# Patient Record
Sex: Female | Born: 1950 | Hispanic: No | Smoking: Never smoker
Health system: Southern US, Community
[De-identification: ages and names within clinical notes are randomized; demographics above are authoritative.]

## PROBLEM LIST (undated history)

## (undated) DIAGNOSIS — R5382 Chronic fatigue, unspecified: Secondary | ICD-10-CM

## (undated) DIAGNOSIS — E7800 Pure hypercholesterolemia, unspecified: Secondary | ICD-10-CM

## (undated) DIAGNOSIS — M1990 Unspecified osteoarthritis, unspecified site: Secondary | ICD-10-CM

## (undated) DIAGNOSIS — C50919 Malignant neoplasm of unspecified site of unspecified female breast: Secondary | ICD-10-CM

## (undated) DIAGNOSIS — D649 Anemia, unspecified: Secondary | ICD-10-CM

## (undated) DIAGNOSIS — I10 Essential (primary) hypertension: Secondary | ICD-10-CM

## (undated) DIAGNOSIS — E119 Type 2 diabetes mellitus without complications: Secondary | ICD-10-CM

## (undated) HISTORY — PX: MASTECTOMY: SHX3

## (undated) HISTORY — PX: EYE SURGERY: SHX253

## (undated) HISTORY — PX: COLONOSCOPY: SHX174

---

## 2014-09-05 ENCOUNTER — Encounter (HOSPITAL_COMMUNITY)
Admission: RE | Admit: 2014-09-05 | Discharge: 2014-09-05 | Disposition: A | Discharge status: Home / Self Care | Payer: Self-pay | Source: Ambulatory Visit | Attending: Gastroenterology | Admitting: Gastroenterology

## 2014-09-05 ENCOUNTER — Other Ambulatory Visit (HOSPITAL_COMMUNITY): Payer: Self-pay | Admitting: *Deleted

## 2014-09-05 DIAGNOSIS — D500 Iron deficiency anemia secondary to blood loss (chronic): Secondary | ICD-10-CM | POA: Insufficient documentation

## 2014-09-05 LAB — PREPARE RBC (CROSSMATCH)

## 2014-09-05 LAB — ABO/RH: ABO/RH(D): B POS

## 2014-09-06 ENCOUNTER — Encounter (HOSPITAL_COMMUNITY)
Admission: RE | Admit: 2014-09-06 | Discharge: 2014-09-06 | Disposition: A | Discharge status: Home / Self Care | Payer: Self-pay | Source: Ambulatory Visit | Attending: Gastroenterology | Admitting: Gastroenterology

## 2014-09-06 ENCOUNTER — Encounter (HOSPITAL_COMMUNITY): Payer: Self-pay

## 2014-09-06 MED ORDER — SODIUM CHLORIDE 0.9 % IV SOLN: Freq: Once | INTRAVENOUS | Status: DC

## 2014-09-07 LAB — TYPE AND SCREEN
ABO/RH(D): B POS
Antibody Screen: NEGATIVE
Unit division: 0
Unit division: 0

## 2015-11-03 ENCOUNTER — Other Ambulatory Visit: Payer: Self-pay | Admitting: Family Medicine

## 2015-11-03 DIAGNOSIS — N6320 Unspecified lump in the left breast, unspecified quadrant: Secondary | ICD-10-CM

## 2015-11-08 ENCOUNTER — Other Ambulatory Visit: Payer: Self-pay | Admitting: Family Medicine

## 2015-11-08 ENCOUNTER — Ambulatory Visit
Admission: RE | Admit: 2015-11-08 | Discharge: 2015-11-08 | Disposition: A | Discharge status: Home / Self Care | Payer: No Typology Code available for payment source | Source: Ambulatory Visit | Attending: Family Medicine | Admitting: Family Medicine

## 2015-11-08 DIAGNOSIS — N6320 Unspecified lump in the left breast, unspecified quadrant: Secondary | ICD-10-CM

## 2015-11-08 DIAGNOSIS — N6310 Unspecified lump in the right breast, unspecified quadrant: Secondary | ICD-10-CM

## 2015-11-14 ENCOUNTER — Other Ambulatory Visit: Payer: Self-pay | Admitting: Family Medicine

## 2015-11-14 DIAGNOSIS — N6320 Unspecified lump in the left breast, unspecified quadrant: Secondary | ICD-10-CM

## 2015-11-16 ENCOUNTER — Ambulatory Visit
Admission: RE | Admit: 2015-11-16 | Discharge: 2015-11-16 | Disposition: A | Discharge status: Home / Self Care | Payer: No Typology Code available for payment source | Source: Ambulatory Visit | Attending: Family Medicine | Admitting: Family Medicine

## 2015-11-16 ENCOUNTER — Other Ambulatory Visit: Payer: Self-pay | Admitting: Family Medicine

## 2015-11-16 DIAGNOSIS — N6320 Unspecified lump in the left breast, unspecified quadrant: Secondary | ICD-10-CM

## 2015-11-16 DIAGNOSIS — C50919 Malignant neoplasm of unspecified site of unspecified female breast: Secondary | ICD-10-CM

## 2015-11-16 HISTORY — DX: Malignant neoplasm of unspecified site of unspecified female breast: C50.919

## 2015-11-27 ENCOUNTER — Ambulatory Visit: Payer: Self-pay | Admitting: Surgery

## 2015-11-30 ENCOUNTER — Other Ambulatory Visit: Payer: Self-pay | Admitting: Surgery

## 2015-11-30 ENCOUNTER — Encounter: Payer: Self-pay | Admitting: Radiation Oncology

## 2015-11-30 DIAGNOSIS — C50912 Malignant neoplasm of unspecified site of left female breast: Secondary | ICD-10-CM

## 2015-12-01 ENCOUNTER — Other Ambulatory Visit: Payer: No Typology Code available for payment source

## 2015-12-04 ENCOUNTER — Encounter: Payer: Self-pay | Admitting: *Deleted

## 2015-12-04 ENCOUNTER — Encounter: Payer: Self-pay | Admitting: Hematology and Oncology

## 2015-12-04 ENCOUNTER — Ambulatory Visit (HOSPITAL_BASED_OUTPATIENT_CLINIC_OR_DEPARTMENT_OTHER): Payer: Self-pay | Admitting: Hematology and Oncology

## 2015-12-04 DIAGNOSIS — C50512 Malignant neoplasm of lower-outer quadrant of left female breast: Secondary | ICD-10-CM

## 2015-12-04 DIAGNOSIS — C773 Secondary and unspecified malignant neoplasm of axilla and upper limb lymph nodes: Secondary | ICD-10-CM

## 2015-12-04 MED ORDER — ANASTROZOLE 1 MG PO TABS
1.0000 mg | ORAL_TABLET | Freq: Every day | ORAL | 3 refills | Status: DC
Start: 2015-12-04 — End: 2016-02-13

## 2015-12-04 MED FILL — ANASTROZOLE 1 MG TABLET: 1 | 60 days supply | Qty: 60 | Fill #0

## 2015-12-04 NOTE — Progress Notes (Signed)
[facility] CONSULT NOTE  Patient Care Team: Pcp Not In System as PCP - General  CHIEF COMPLAINTS/PURPOSE OF CONSULTATION:  Newly diagnosed breast cancer  HISTORY OF PRESENTING ILLNESS:  Theresa Zhang 65 y.o. female is here because of recent diagnosis of left breast cancer. Patient felt a lump about 6 weeks ago and was sent to undergo a mammogram. It revealed 2 nodules measuring 2 cm and 1.4 cm. 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 She was seen by Dr. Brantley Stage to discuss surgical options and she is here today to discuss a treatment plan with me. She is accompanied by her son. Patient speaks Hindi   I reviewed her records extensively and collaborated the history with the patient.  SUMMARY OF ONCOLOGIC HISTORY:   Breast cancer of lower-outer quadrant of left female breast ([place])   11/08/2015 Mammogram    Left breast 5:30 position: 1.7 x 1.6 x 2 cm spiculated mass; 6:00: 1.2 x 1.3 x 1.4 cm separated by 3 cm, left axillary lymph nodes 9 mm, clinical stage TIcN1 stage II A     11/16/2015 Initial Diagnosis    Left breast biopsy: 6:00: Grade 2-3 IDC with DCIS with LVI, ER 90%, PR 95%, Ki-67 3%, HER-2 negative ratio 1.33, left breast biopsy 5:30 position: Grade 2-3 IDC, DCIS, LVI, ER 90%, PR 95%, Ki-67 5%, HER-2 negative ratio 1.35     MEDICAL HISTORY:  Past Medical History:  Diagnosis Date  . Anemia   . Arthritis   . Breast cancer ([place]) 11/16/2015   Left Breast  . Chronic fatigue   . Diabetes mellitus without complication ([place])   . DJD (degenerative joint disease)    knee  . Hypercholesterolemia   . Hypertension     SURGICAL HISTORY: No past surgical history on file.  SOCIAL HISTORY: Social History   Social History  . Marital status: Widowed    Spouse name: N/A  . Number of children: N/A  . Years of education: N/A   Occupational History  . Not  on file.   Social History Main Topics  . Smoking status: Never Smoker  . Smokeless tobacco: Never Used  . Alcohol use No  . Drug use: No  . Sexual activity: Not on file   Other Topics Concern  . Not on file   Social History Narrative  . No narrative on file    FAMILY HISTORY: No family history of any breast cancer  ALLERGIES:  is allergic to metformin.  MEDICATIONS:  Current Outpatient Prescriptions  Medication Sig Dispense Refill  . anastrozole (ARIMIDEX) 1 MG tablet Take 1 tablet (1 mg total) by mouth daily. 90 tablet 3  . glimepiride (AMARYL) 4 MG tablet Take 4 mg by mouth daily. Before breakfast    . losartan (COZAAR) 25 MG tablet Take 50 mg by mouth daily.    . meloxicam (MOBIC) 15 MG tablet Take 15 mg by mouth daily.    . metoprolol succinate (TOPROL-XL) 25 MG 24 hr tablet Take 25 mg by mouth daily.     No current facility-administered medications for this visit.     REVIEW OF SYSTEMS:   Constitutional: Denies fevers, chills or abnormal night sweats Eyes: Denies blurriness of vision, double vision or watery eyes Ears, nose, mouth, throat, and face: Denies mucositis or sore throat Respiratory: Denies cough, dyspnea or wheezes Cardiovascular: Denies palpitation, chest discomfort or lower  extremity swelling Gastrointestinal:  Denies nausea, heartburn or change in bowel habits Skin: Denies abnormal skin rashes Lymphatics: Denies new lymphadenopathy or easy bruising Neurological:Denies numbness, tingling or new weaknesses Behavioral/Psych: Mood is stable, no new changes  Breast: Palpable lumps in the left breast All other systems were reviewed with the patient and are negative.  PHYSICAL EXAMINATION: ECOG PERFORMANCE STATUS: 1 - Symptomatic but completely ambulatory  Vitals:   12/04/15 1324  BP: (!) 180/65  Pulse: 87  Resp: 18  Temp: 97.6 F (36.4 C)   Filed Weights   12/04/15 1324  Weight: 178 lb 3.2 oz (80.8 kg)    GENERAL:alert, no distress and  comfortable SKIN: skin color, texture, turgor are normal, no rashes or significant lesions EYES: normal, conjunctiva are pink and non-injected, sclera clear OROPHARYNX:no exudate, no erythema and lips, buccal mucosa, and tongue normal  NECK: supple, thyroid normal size, non-tender, without nodularity LYMPH:  no palpable lymphadenopathy in the cervical, axillary or inguinal LUNGS: clear to auscultation and percussion with normal breathing effort HEART: regular rate & rhythm and no murmurs and no lower extremity edema ABDOMEN:abdomen soft, non-tender and normal bowel sounds Musculoskeletal:no cyanosis of digits and no clubbing  PSYCH: alert & oriented x 3 with fluent speech NEURO: no focal motor/sensory deficits BREAST: 2 palpable lumps in the left breast. No palpable axillary or supraclavicular lymphadenopathy (exam performed in the presence of a chaperone)   RADIOGRAPHIC STUDIES: I have personally reviewed the radiological reports and agreed with the findings in the report.  ASSESSMENT AND PLAN:  Breast cancer of lower-outer quadrant of left female breast ([place]) Left breast 5:30 position 11/08/2015: 1.7 x 1.6 x 2 cm spiculated mass; 6:00: 1.2 x 1.3 x 1.4 cm separated by 3 cm, left axillary lymph nodes 9 mm, clinical stage TIcN1 stage II A Left breast biopsy 11/16/2015: 6:00: Grade 2-3 IDC with DCIS with LVI, ER 90%, PR 95%, Ki-67 3%, HER-2 negative ratio 1.33, left breast biopsy 5:30 position: Grade 2-3 IDC, DCIS, LVI, ER 90%, PR 95%, Ki-67 5%, HER-2 negative ratio 1.35  Pathology and radiology counseling: Discussed with the patient, the details of pathology including the type of breast cancer,the clinical staging, the significance of ER, PR and HER-2/neu receptors and the implications for treatment. After reviewing the pathology in detail, we proceeded to discuss the different treatment options between surgery, radiation, chemotherapy, antiestrogen therapies.  Recommendation: 1. I started  Neoadjuvant therapy with antiestrogen therapy anastrozole 1 mg daily until the decision regarding surgery has been made. 2. Surgery: We discussed at length the importance of surgery including breast cancer. After discussion she was agreeable to it. I will discuss the case with Dr. Brantley Stage to see if he would consider doing a lumpectomy versus mastectomy. Patient understands that if she were to get lumpectomy then she will need adjuvant radiation definitely.  3. Followed by radiation 4. Followed by antiestrogen therapy  Patient is in favor of lumpectomy with radiation.  I will call the patient back to discuss the final decision regarding surgery versus neoadjuvant therapy. If we need to do neoadjuvant chemotherapy, I would consider sending for Oncotype DX testing or Mammaprint testing. We may also have to consider obtaining a lymph node biopsy and a breast MRI if neoadjuvant therapy is the plan.  Patient is uninsured and would like to meet with financial counselors. We provided her with this information. I spent one half hour discussing treatment options with the patient and her son and she understands this and is willing to  go through the recommended treatment. She was originally quite reluctant to consider surgery.    All questions were answered. The patient knows to call the clinic with any problems, questions or concerns.    Rulon Eisenmenger, MD 12/04/15

## 2015-12-04 NOTE — Assessment & Plan Note (Signed)
Left breast 5:30 position 11/08/2015: 1.7 x 1.6 x 2 cm spiculated mass; 6:00: 1.2 x 1.3 x 1.4 cm separated by 3 cm, left axillary lymph nodes 9 mm, clinical stage TIcN1 stage II A Left breast biopsy 11/16/2015: 6:00: Grade 2-3 IDC with DCIS with LVI, ER 90%, PR 95%, Ki-67 3%, HER-2 negative ratio 1.33, left breast biopsy 5:30 position: Grade 2-3 IDC, DCIS, LVI, ER 90%, PR 95%, Ki-67 5%, HER-2 negative ratio 1.35  Pathology and radiology counseling: Discussed with the patient, the details of pathology including the type of breast cancer,the clinical staging, the significance of ER, PR and HER-2/neu receptors and the implications for treatment. After reviewing the pathology in detail, we proceeded to discuss the different treatment options between surgery, radiation, chemotherapy, antiestrogen therapies.  Recommendation: 1. Axillary lymph node biopsy 2. If the lymph node is positive: Mammaprint testing on the biopsy to determine if she would need chemotherapy 3. If the lymph node is negative: Oncotype DX testing on the biopsy to determine if she would need chemotherapy 4. Neoadjuvant therapy either with chemotherapy or antiestrogen therapy 5. Followed by surgery 6. Followed by radiation 7. Followed by antiestrogen therapy

## 2015-12-06 ENCOUNTER — Ambulatory Visit
Admission: RE | Admit: 2015-12-06 | Discharge: 2015-12-06 | Disposition: A | Discharge status: Home / Self Care | Payer: No Typology Code available for payment source | Source: Ambulatory Visit | Attending: Radiation Oncology | Admitting: Radiation Oncology

## 2015-12-06 ENCOUNTER — Ambulatory Visit: Payer: No Typology Code available for payment source

## 2015-12-06 HISTORY — DX: Chronic fatigue, unspecified: R53.82

## 2015-12-06 HISTORY — DX: Anemia, unspecified: D64.9

## 2015-12-06 HISTORY — DX: Essential (primary) hypertension: I10

## 2015-12-06 HISTORY — DX: Pure hypercholesterolemia, unspecified: E78.00

## 2015-12-06 HISTORY — DX: Unspecified osteoarthritis, unspecified site: M19.90

## 2015-12-06 HISTORY — DX: Malignant neoplasm of unspecified site of unspecified female breast: C50.919

## 2015-12-06 HISTORY — DX: Type 2 diabetes mellitus without complications: E11.9

## 2015-12-11 ENCOUNTER — Other Ambulatory Visit: Payer: No Typology Code available for payment source

## 2015-12-27 ENCOUNTER — Telehealth: Payer: Self-pay | Admitting: *Deleted

## 2015-12-27 NOTE — Telephone Encounter (Signed)
Son states he is not sure what the plan is for his mother. Saw Dr Lindi Adie on 8/7 and has "not heard anything else". States patient does not have insurance. He says she has an appt on 9/11 with Dr Brantley Stage. Informed him that is part of the process to see what needs to be done per Dr Geralyn Flash office visit notes- lumpectomy VS mastectomy. Verbalized understanding. Said that scan will be $2400 out of pocket. Encouraged him to call Dr Cornett's office regarding scan. Once a plan is decided upon, they will meet with financial counselors here at [facility]. Please advise.

## 2016-01-08 ENCOUNTER — Ambulatory Visit: Payer: Self-pay | Admitting: Surgery

## 2016-01-08 DIAGNOSIS — C50912 Malignant neoplasm of unspecified site of left female breast: Secondary | ICD-10-CM

## 2016-01-08 NOTE — H&P (Signed)
Theresa Zhang 01/08/2016 10:48 AM Location: [facility] Patient #: [id number] DOB: 18-Oct-1950 Widowed / Language: Hindi / Race: Refused to Report/Unreported Female  History of Present Illness Theresa Zhang A. Jaja Switalski MD; 01/08/2016 11:33 AM) Patient words: Patient seen at the request of Dr. Theda Sers for left breast cancer. She noticed to masses and pain in her left breast about 2 months ago. Mammogram revealed lesions in the left lower inner quadrant of the left breast each measuring roughly 1.7 cm. They're separated by 3 cm. Core biopsy showed invasive ductal carcinoma ER positive PR positive HER-2/neu negative for both. She is accompanied by her family. Patient's only complaint was left breast pain.     Per oncology   Theresa Zhang 65 y.o. female is here because of recent diagnosis of left breast cancer. Patient felt a lump about 6 weeks ago and was sent to undergo a mammogram. It revealed 2 nodules measuring 2 cm and 1.4 cm. 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 She was seen by Dr. Brantley Stage to discuss surgical options and she is here today to discuss a treatment plan with me. She is accompanied by her son. Patient speaks Hindi              Discussed with the patient, the details of pathology including the type of breast cancer,the clinical staging, the significance of ER, PR and HER-2/neu receptors and the implications for treatment. After reviewing the pathology in detail, we proceeded to discuss the different treatment options between surgery, radiation, chemotherapy, antiestrogen therapies.  Recommendation: 1. Axillary lymph node biopsy 2. If the lymph node is positive: Mammaprint testing on the biopsy to determine if she would need chemotherapy 3. If the lymph node is negative: Oncotype DX testing on the biopsy to determine if she would need  chemotherapy 4. Neoadjuvant therapy either with chemotherapy or antiestrogen therapy 5. Followed by surgery 6. Followed by radiation 7. Followed by antiestrogen therapy.  The patient is a 65 year old female.   Allergies ([name], CMA; 01/08/2016 10:49 AM) MetFORMIN HCl *ANTIDIABETICS*  Medication History ([name], CMA; 01/08/2016 10:49 AM) Losartan Potassium (25MG Tablet, Oral) Active. Metoprolol Succinate ER (25MG Tablet ER 24HR, Oral) Active. Meloxicam (15MG Tablet, Oral) Active. Glimepiride (4MG Tablet, Oral) Active. Medications Reconciled    Vitals ([name] CMA; 01/08/2016 10:49 AM) 01/08/2016 10:48 AM Weight: 172 lb Height: 59in Body Surface Area: 1.73 m Body Mass Index: 34.74 kg/m  Temp.: 70F(Temporal)  Pulse: 73 (Regular)  BP: 128/76 (Sitting, Left Arm, Standard)      Physical Exam ([name] A. Lon Klippel MD; 01/08/2016 11:34 AM)  General Mental Status-Alert. General Appearance-Consistent with stated age. Hydration-Well hydrated. Voice-Normal.  Breast Note: not repeated  Musculoskeletal Normal Exam - Left-Upper Extremity Strength Normal and Lower Extremity Strength Normal. Normal Exam - Right-Upper Extremity Strength Normal, Lower Extremity Weakness.    Assessment & Plan ([name] A. Edenilson Austad MD; 01/08/2016 11:34 AM)  BREAST CANCER, LEFT (C50.912) Impression: Case discussed with Dr. Payton Mccallum of oncology. Patient desires left simple mastectomy with left sentinel lymph node mapping. She has no interest in reconstruction. I discussed this with the patient and family members today who helped to communicate with the patient. Questions were answered. Risk was reviewed as well as long-term expectations, treatments and possible complications. Discussed treatment options for breast cancer to include breast conservation vs mastectomy with reconstruction. Pt has decided on mastectomy. Risk include  bleeding, infection, flap necrosis, pain,  numbness, recurrence, hematoma, other surgery needs. Pt understands and agrees to proceed.  Current Plans You are being scheduled for surgery - Our schedulers will call you.  You should hear from our office's scheduling department within 5 working days about the location, date, and time of surgery. We try to make accommodations for patient's preferences in scheduling surgery, but sometimes the OR schedule or the surgeon's schedule prevents [place] from making those accommodations.  If you have not heard from our office [phone]) in 5 working days, call the office and ask for your surgeon's nurse.  If you have other questions about your diagnosis, plan, or surgery, call the office and ask for your surgeon's nurse.  Pt Education - [facility] Breast Cancer Information Given - Alight "Breast Journey" Package We discussed the staging and pathophysiology of breast cancer. We discussed all of the different options for treatment for breast cancer including surgery, chemotherapy, radiation therapy, Herceptin, and antiestrogen therapy. We discussed a sentinel lymph node biopsy as she does not appear to having lymph node involvement right now. We discussed the performance of that with injection of radioactive tracer and blue dye. We discussed that she would have an incision underneath her axillary hairline. We discussed that there is a bout a 10-20% chance of having a positive node with a sentinel lymph node biopsy and we will await the permanent pathology to make any other first further decisions in terms of her treatment. One of these options might be to return to the operating room to perform an axillary lymph node dissection. We discussed about a 1-2% risk lifetime of chronic shoulder pain as well as lymphedema associated with a sentinel lymph node biopsy. We discussed the options for treatment of the breast cancer which included lumpectomy versus a mastectomy. We discussed the performance of the lumpectomy with a  wire placement. We discussed a 10-20% chance of a positive margin requiring reexcision in the operating room. We also discussed that she may need radiation therapy or antiestrogen therapy or both if she undergoes lumpectomy. We discussed the mastectomy and the postoperative care for that as well. We discussed that there is no difference in her survival whether she undergoes lumpectomy with radiation therapy or antiestrogen therapy versus a mastectomy. There is a slight difference in the local recurrence rate being 3-5% with lumpectomy and about 1% with a mastectomy. We discussed the risks of operation including bleeding, infection, possible reoperation. She understands her further therapy will be based on what her stages at the time of her operation.  Pt Education - flb breast cancer surgery: discussed with patient and provided information. Pt Education - [facility] Mastectomy HCI Pt Education - ABC (After Breast Cancer) Class Info: discussed with patient and provided information.

## 2016-01-10 ENCOUNTER — Telehealth: Payer: Self-pay | Admitting: Hematology and Oncology

## 2016-01-10 ENCOUNTER — Encounter (HOSPITAL_COMMUNITY): Payer: Self-pay | Admitting: *Deleted

## 2016-01-10 MED ORDER — DEXTROSE 5 % IV SOLN
3.0000 g | INTRAVENOUS | Status: AC
  Administered 2016-01-11: 3 g via INTRAVENOUS
  Filled 2016-01-10: qty 3000

## 2016-01-10 NOTE — Telephone Encounter (Signed)
lvm using language line to inform pt of appt 9/25 per LOS

## 2016-01-10 NOTE — Progress Notes (Signed)
Pt speaks Hindi. Called pt using [employer]. Spoke with pt, but she okayed her daughter-in-law, Festus Holts to talk to me. Festus Holts speaks [place]. Sonya ended her side of the call. Festus Holts states pt does not have a cardiac history. Pt is diabetic but does not check her blood sugar at home due to being "deathly afraid of needles" per [place]. She states pt's last A1C was 7.0 about 4 months ago.

## 2016-01-11 ENCOUNTER — Encounter (HOSPITAL_COMMUNITY)
Admission: RE | Disposition: A | Discharge status: Home / Self Care | Payer: Self-pay | Source: Ambulatory Visit | Attending: Surgery

## 2016-01-11 ENCOUNTER — Observation Stay (HOSPITAL_COMMUNITY)
Admission: RE | Admit: 2016-01-11 | Discharge: 2016-01-12 | Disposition: A | Discharge status: Home / Self Care | Payer: No Typology Code available for payment source | Source: Ambulatory Visit | Attending: Surgery | Admitting: Surgery

## 2016-01-11 ENCOUNTER — Encounter (HOSPITAL_COMMUNITY): Payer: Self-pay | Admitting: *Deleted

## 2016-01-11 ENCOUNTER — Ambulatory Visit (HOSPITAL_COMMUNITY)
Admission: RE | Admit: 2016-01-11 | Discharge: 2016-01-11 | Disposition: A | Discharge status: Home / Self Care | Payer: Self-pay | Source: Ambulatory Visit | Attending: Surgery | Admitting: Surgery

## 2016-01-11 ENCOUNTER — Ambulatory Visit (HOSPITAL_COMMUNITY): Payer: Self-pay | Admitting: Certified Registered Nurse Anesthetist

## 2016-01-11 DIAGNOSIS — C50312 Malignant neoplasm of lower-inner quadrant of left female breast: Principal | ICD-10-CM | POA: Insufficient documentation

## 2016-01-11 DIAGNOSIS — E119 Type 2 diabetes mellitus without complications: Secondary | ICD-10-CM | POA: Insufficient documentation

## 2016-01-11 DIAGNOSIS — Z170 Estrogen receptor positive status [ER+]: Secondary | ICD-10-CM | POA: Insufficient documentation

## 2016-01-11 DIAGNOSIS — Z791 Long term (current) use of non-steroidal anti-inflammatories (NSAID): Secondary | ICD-10-CM | POA: Insufficient documentation

## 2016-01-11 DIAGNOSIS — Z7984 Long term (current) use of oral hypoglycemic drugs: Secondary | ICD-10-CM | POA: Insufficient documentation

## 2016-01-11 DIAGNOSIS — C50412 Malignant neoplasm of upper-outer quadrant of left female breast: Secondary | ICD-10-CM | POA: Diagnosis present

## 2016-01-11 DIAGNOSIS — C50912 Malignant neoplasm of unspecified site of left female breast: Secondary | ICD-10-CM

## 2016-01-11 DIAGNOSIS — Z79899 Other long term (current) drug therapy: Secondary | ICD-10-CM | POA: Insufficient documentation

## 2016-01-11 DIAGNOSIS — I10 Essential (primary) hypertension: Secondary | ICD-10-CM | POA: Insufficient documentation

## 2016-01-11 HISTORY — PX: MASTECTOMY W/ SENTINEL NODE BIOPSY: SHX2001

## 2016-01-11 LAB — CBC WITH DIFFERENTIAL/PLATELET
BASOS ABS: 0 10*3/uL (ref 0.0–0.1)
BASOS PCT: 0 %
EOS ABS: 0.1 10*3/uL (ref 0.0–0.7)
EOS PCT: 2 %
HCT: 39 % (ref 36.0–46.0)
Hemoglobin: 12 g/dL (ref 12.0–15.0)
Lymphocytes Relative: 24 %
Lymphs Abs: 1.8 10*3/uL (ref 0.7–4.0)
MCH: 25.3 pg — ABNORMAL LOW (ref 26.0–34.0)
MCHC: 30.8 g/dL (ref 30.0–36.0)
MCV: 82.3 fL (ref 78.0–100.0)
MONO ABS: 0.5 10*3/uL (ref 0.1–1.0)
Monocytes Relative: 7 %
Neutro Abs: 5.1 10*3/uL (ref 1.7–7.7)
Neutrophils Relative %: 67 %
PLATELETS: 290 10*3/uL (ref 150–400)
RBC: 4.74 MIL/uL (ref 3.87–5.11)
RDW: 13.9 % (ref 11.5–15.5)
WBC: 7.5 10*3/uL (ref 4.0–10.5)

## 2016-01-11 LAB — COMPREHENSIVE METABOLIC PANEL
ALT: 35 U/L (ref 14–54)
AST: 39 U/L (ref 15–41)
Albumin: 3.5 g/dL (ref 3.5–5.0)
Alkaline Phosphatase: 80 U/L (ref 38–126)
Anion gap: 8 (ref 5–15)
BUN: 26 mg/dL — ABNORMAL HIGH (ref 6–20)
CO2: 25 mmol/L (ref 22–32)
Calcium: 9.3 mg/dL (ref 8.9–10.3)
Chloride: 107 mmol/L (ref 101–111)
Creatinine, Ser: 0.87 mg/dL (ref 0.44–1.00)
GFR calc Af Amer: >60 mL/min (ref >60)
GFR calc non Af Amer: >60 mL/min (ref >60)
Glucose, Bld: 102 mg/dL — ABNORMAL HIGH (ref 65–99)
Potassium: 4.5 mmol/L (ref 3.5–5.1)
Sodium: 140 mmol/L (ref 135–145)
Total Bilirubin: 0.5 mg/dL (ref 0.3–1.2)
Total Protein: 7.3 g/dL (ref 6.5–8.1)

## 2016-01-11 LAB — GLUCOSE, CAPILLARY
GLUCOSE-CAPILLARY: 126 mg/dL — AB (ref 65–99)
GLUCOSE-CAPILLARY: 183 mg/dL — AB (ref 65–99)
Glucose-Capillary: 100 mg/dL — ABNORMAL HIGH (ref 65–99)
Glucose-Capillary: 188 mg/dL — ABNORMAL HIGH (ref 65–99)

## 2016-01-11 SURGERY — MASTECTOMY WITH SENTINEL LYMPH NODE BIOPSY
Anesthesia: Regional | Site: Breast | Laterality: Left

## 2016-01-11 MED ORDER — FENTANYL CITRATE (PF) 100 MCG/2ML IJ SOLN
INTRAMUSCULAR | Status: AC
  Filled 2016-01-11: qty 2

## 2016-01-11 MED ORDER — HYDRALAZINE HCL 20 MG/ML IJ SOLN: 10.0000 mg | INTRAMUSCULAR | Status: DC | PRN

## 2016-01-11 MED ORDER — MIDAZOLAM HCL 2 MG/2ML IJ SOLN
INTRAMUSCULAR | Status: AC
  Filled 2016-01-11: qty 2

## 2016-01-11 MED ORDER — POLYETHYLENE GLYCOL 3350 17 G PO PACK: 17.0000 g | PACK | Freq: Every day | ORAL | Status: DC | PRN

## 2016-01-11 MED ORDER — PROPOFOL 10 MG/ML IV BOLUS
INTRAVENOUS | Status: DC | PRN
  Administered 2016-01-11: 150 mg via INTRAVENOUS

## 2016-01-11 MED ORDER — BUPIVACAINE-EPINEPHRINE (PF) 0.5% -1:200000 IJ SOLN
INTRAMUSCULAR | Status: DC | PRN
  Administered 2016-01-11: 25 mL

## 2016-01-11 MED ORDER — KCL IN DEXTROSE-NACL 20-5-0.9 MEQ/L-%-% IV SOLN
INTRAVENOUS | Status: DC
  Administered 2016-01-11 – 2016-01-12 (×2): via INTRAVENOUS
  Filled 2016-01-11 (×2): qty 1000

## 2016-01-11 MED ORDER — HYDROMORPHONE HCL 1 MG/ML IJ SOLN: 0.2500 mg | INTRAMUSCULAR | Status: DC | PRN

## 2016-01-11 MED ORDER — MIDAZOLAM HCL 2 MG/2ML IJ SOLN
1.0000 mg | Freq: Once | INTRAMUSCULAR | Status: AC
  Administered 2016-01-11: 1 mg via INTRAVENOUS

## 2016-01-11 MED ORDER — ONDANSETRON HCL 4 MG/2ML IJ SOLN
INTRAMUSCULAR | Status: AC
  Filled 2016-01-11: qty 2

## 2016-01-11 MED ORDER — TECHNETIUM TC 99M SULFUR COLLOID FILTERED
1.0000 | Freq: Once | INTRAVENOUS | Status: AC | PRN
  Administered 2016-01-11: 1 via INTRADERMAL

## 2016-01-11 MED ORDER — PROPOFOL 10 MG/ML IV BOLUS
INTRAVENOUS | Status: AC
  Filled 2016-01-11: qty 20

## 2016-01-11 MED ORDER — PROMETHAZINE HCL 25 MG/ML IJ SOLN: 6.2500 mg | INTRAMUSCULAR | Status: DC | PRN

## 2016-01-11 MED ORDER — CHLORHEXIDINE GLUCONATE CLOTH 2 % EX PADS: 6.0000 | MEDICATED_PAD | Freq: Once | CUTANEOUS | Status: DC

## 2016-01-11 MED ORDER — ONDANSETRON 4 MG PO TBDP: 4.0000 mg | ORAL_TABLET | Freq: Four times a day (QID) | ORAL | Status: DC | PRN

## 2016-01-11 MED ORDER — ENOXAPARIN SODIUM 40 MG/0.4ML ~~LOC~~ SOLN
40.0000 mg | SUBCUTANEOUS | Status: DC
  Administered 2016-01-12: 40 mg via SUBCUTANEOUS
  Filled 2016-01-11: qty 0.4

## 2016-01-11 MED ORDER — EPHEDRINE SULFATE 50 MG/ML IJ SOLN
INTRAMUSCULAR | Status: DC | PRN
  Administered 2016-01-11: 5 mg via INTRAVENOUS

## 2016-01-11 MED ORDER — DEXAMETHASONE SODIUM PHOSPHATE 10 MG/ML IJ SOLN
INTRAMUSCULAR | Status: AC
  Filled 2016-01-11: qty 1

## 2016-01-11 MED ORDER — HYDROMORPHONE HCL 1 MG/ML IJ SOLN
1.0000 mg | INTRAMUSCULAR | Status: DC | PRN
Start: 2016-01-11 — End: 2016-01-12

## 2016-01-11 MED ORDER — METHOCARBAMOL 500 MG PO TABS
500.0000 mg | ORAL_TABLET | Freq: Four times a day (QID) | ORAL | Status: DC | PRN
  Administered 2016-01-11 – 2016-01-12 (×2): 500 mg via ORAL
  Filled 2016-01-11 (×2): qty 1

## 2016-01-11 MED ORDER — OXYCODONE HCL 5 MG PO TABS
5.0000 mg | ORAL_TABLET | ORAL | Status: DC | PRN
  Administered 2016-01-11: 10 mg via ORAL
  Filled 2016-01-11: qty 2

## 2016-01-11 MED ORDER — ONDANSETRON HCL 4 MG/2ML IJ SOLN
INTRAMUSCULAR | Status: DC | PRN
  Administered 2016-01-11: 4 mg via INTRAVENOUS

## 2016-01-11 MED ORDER — PHENYLEPHRINE 40 MCG/ML (10ML) SYRINGE FOR IV PUSH (FOR BLOOD PRESSURE SUPPORT)
PREFILLED_SYRINGE | INTRAVENOUS | Status: AC
  Filled 2016-01-11: qty 10

## 2016-01-11 MED ORDER — SIMETHICONE 80 MG PO CHEW: 40.0000 mg | CHEWABLE_TABLET | Freq: Four times a day (QID) | ORAL | Status: DC | PRN

## 2016-01-11 MED ORDER — METOPROLOL SUCCINATE ER 25 MG PO TB24
25.0000 mg | ORAL_TABLET | Freq: Every day | ORAL | Status: DC
  Administered 2016-01-12: 25 mg via ORAL
  Filled 2016-01-11 (×2): qty 1

## 2016-01-11 MED ORDER — ONDANSETRON HCL 4 MG/2ML IJ SOLN: 4.0000 mg | Freq: Four times a day (QID) | INTRAMUSCULAR | Status: DC | PRN

## 2016-01-11 MED ORDER — PHENYLEPHRINE HCL 10 MG/ML IJ SOLN
INTRAMUSCULAR | Status: DC | PRN
  Administered 2016-01-11: 80 ug via INTRAVENOUS
  Administered 2016-01-11: 120 ug via INTRAVENOUS
  Administered 2016-01-11: 80 ug via INTRAVENOUS
  Administered 2016-01-11: 120 ug via INTRAVENOUS
  Administered 2016-01-11 (×2): 80 ug via INTRAVENOUS

## 2016-01-11 MED ORDER — LIDOCAINE 2% (20 MG/ML) 5 ML SYRINGE
INTRAMUSCULAR | Status: AC
  Filled 2016-01-11: qty 10

## 2016-01-11 MED ORDER — SODIUM CHLORIDE 0.9 % IJ SOLN
INTRAVENOUS | Status: DC | PRN
  Administered 2016-01-11: 2 mL

## 2016-01-11 MED ORDER — HYDROCODONE-ACETAMINOPHEN 7.5-325 MG PO TABS: 1.0000 | ORAL_TABLET | Freq: Once | ORAL | Status: DC | PRN

## 2016-01-11 MED ORDER — LIDOCAINE HCL (CARDIAC) 20 MG/ML IV SOLN
INTRAVENOUS | Status: DC | PRN
  Administered 2016-01-11: 60 mg via INTRAVENOUS

## 2016-01-11 MED ORDER — EPHEDRINE 5 MG/ML INJ
INTRAVENOUS | Status: AC
  Filled 2016-01-11: qty 10

## 2016-01-11 MED ORDER — ROCURONIUM BROMIDE 10 MG/ML (PF) SYRINGE
PREFILLED_SYRINGE | INTRAVENOUS | Status: AC
  Filled 2016-01-11: qty 20

## 2016-01-11 MED ORDER — DIPHENHYDRAMINE HCL 50 MG/ML IJ SOLN: 12.5000 mg | Freq: Four times a day (QID) | INTRAMUSCULAR | Status: DC | PRN

## 2016-01-11 MED ORDER — METHYLENE BLUE 0.5 % INJ SOLN
INTRAVENOUS | Status: AC
  Filled 2016-01-11: qty 10

## 2016-01-11 MED ORDER — ANASTROZOLE 1 MG PO TABS
1.0000 mg | ORAL_TABLET | Freq: Every day | ORAL | Status: DC
  Administered 2016-01-12: 1 mg via ORAL
  Filled 2016-01-11 (×2): qty 1

## 2016-01-11 MED ORDER — INSULIN ASPART 100 UNIT/ML ~~LOC~~ SOLN
0.0000 [IU] | Freq: Three times a day (TID) | SUBCUTANEOUS | Status: DC
Start: 2016-01-11 — End: 2016-01-12
  Administered 2016-01-11: 3 [IU] via SUBCUTANEOUS

## 2016-01-11 MED ORDER — DIPHENHYDRAMINE HCL 12.5 MG/5ML PO ELIX: 12.5000 mg | ORAL_SOLUTION | Freq: Four times a day (QID) | ORAL | Status: DC | PRN

## 2016-01-11 MED ORDER — 0.9 % SODIUM CHLORIDE (POUR BTL) OPTIME
TOPICAL | Status: DC | PRN
  Administered 2016-01-11 (×2): 1000 mL

## 2016-01-11 MED ORDER — KETOROLAC TROMETHAMINE 30 MG/ML IJ SOLN: 30.0000 mg | Freq: Once | INTRAMUSCULAR | Status: DC | PRN

## 2016-01-11 MED ORDER — LOSARTAN POTASSIUM 50 MG PO TABS
50.0000 mg | ORAL_TABLET | Freq: Every day | ORAL | Status: DC
  Administered 2016-01-11 – 2016-01-12 (×2): 50 mg via ORAL
  Filled 2016-01-11 (×2): qty 1

## 2016-01-11 MED ORDER — GLIMEPIRIDE 4 MG PO TABS
4.0000 mg | ORAL_TABLET | Freq: Every day | ORAL | Status: DC
  Administered 2016-01-12: 4 mg via ORAL
  Filled 2016-01-11: qty 1

## 2016-01-11 MED ORDER — SODIUM CHLORIDE 0.9 % IJ SOLN
INTRAMUSCULAR | Status: DC | PRN
  Administered 2016-01-11: 3 mL

## 2016-01-11 MED ORDER — LACTATED RINGERS IV SOLN
INTRAVENOUS | Status: DC
  Administered 2016-01-11 (×2): via INTRAVENOUS

## 2016-01-11 MED ORDER — ZOLPIDEM TARTRATE 5 MG PO TABS: 5.0000 mg | ORAL_TABLET | Freq: Every evening | ORAL | Status: DC | PRN

## 2016-01-11 MED ORDER — FENTANYL CITRATE (PF) 100 MCG/2ML IJ SOLN
INTRAMUSCULAR | Status: DC | PRN
  Administered 2016-01-11 (×2): 50 ug via INTRAVENOUS

## 2016-01-11 MED ORDER — CEFAZOLIN SODIUM-DEXTROSE 2-4 GM/100ML-% IV SOLN
2.0000 g | Freq: Three times a day (TID) | INTRAVENOUS | Status: AC
  Administered 2016-01-11: 2 g via INTRAVENOUS
  Filled 2016-01-11: qty 100

## 2016-01-11 MED ORDER — FENTANYL CITRATE (PF) 100 MCG/2ML IJ SOLN
50.0000 ug | Freq: Once | INTRAMUSCULAR | Status: AC
  Administered 2016-01-11: 50 ug via INTRAVENOUS

## 2016-01-11 SURGICAL SUPPLY — 41 items
APPLIER CLIP 9.375 MED OPEN (MISCELLANEOUS) ×3
BINDER BREAST LRG (GAUZE/BANDAGES/DRESSINGS) IMPLANT
CANISTER SUCTION 2500CC (MISCELLANEOUS) ×3 IMPLANT
CHLORAPREP W/TINT 26ML (MISCELLANEOUS) ×3 IMPLANT
CLIP APPLIE 9.375 MED OPEN (MISCELLANEOUS) ×1 IMPLANT
CONT SPEC 4OZ CLIKSEAL STRL BL (MISCELLANEOUS) ×3 IMPLANT
COVER PROBE W GEL 5X96 (DRAPES) ×3 IMPLANT
COVER SURGICAL LIGHT HANDLE (MISCELLANEOUS) ×3 IMPLANT
DRAIN CHANNEL 19F RND (DRAIN) ×3 IMPLANT
DRAPE LAPAROSCOPIC ABDOMINAL (DRAPES) ×3 IMPLANT
ELECT REM PT RETURN 9FT ADLT (ELECTROSURGICAL) ×3
ELECTRODE REM PT RTRN 9FT ADLT (ELECTROSURGICAL) ×1 IMPLANT
EVACUATOR SILICONE 100CC (DRAIN) ×3 IMPLANT
GLOVE BIO SURGEON STRL SZ8 (GLOVE) ×3 IMPLANT
GLOVE BIOGEL PI IND STRL 7.0 (GLOVE) ×1 IMPLANT
GLOVE BIOGEL PI IND STRL 7.5 (GLOVE) ×1 IMPLANT
GLOVE BIOGEL PI IND STRL 8 (GLOVE) ×1 IMPLANT
GLOVE BIOGEL PI INDICATOR 7.0 (GLOVE) ×2
GLOVE BIOGEL PI INDICATOR 7.5 (GLOVE) ×2
GLOVE BIOGEL PI INDICATOR 8 (GLOVE) ×2
GLOVE SURG SS PI 6.5 STRL IVOR (GLOVE) ×6 IMPLANT
GLOVE SURG SS PI 7.0 STRL IVOR (GLOVE) ×6 IMPLANT
GOWN STRL REUS W/ TWL LRG LVL3 (GOWN DISPOSABLE) ×3 IMPLANT
GOWN STRL REUS W/ TWL XL LVL3 (GOWN DISPOSABLE) ×1 IMPLANT
GOWN STRL REUS W/TWL LRG LVL3 (GOWN DISPOSABLE) ×6
GOWN STRL REUS W/TWL XL LVL3 (GOWN DISPOSABLE) ×2
KIT BASIN OR (CUSTOM PROCEDURE TRAY) ×3 IMPLANT
KIT ROOM TURNOVER OR (KITS) ×3 IMPLANT
LIQUID BAND (GAUZE/BANDAGES/DRESSINGS) ×3 IMPLANT
NEEDLE 18GX1X1/2 (RX/OR ONLY) (NEEDLE) ×3 IMPLANT
NEEDLE HYPO 25GX1X1/2 BEV (NEEDLE) ×3 IMPLANT
NS IRRIG 1000ML POUR BTL (IV SOLUTION) ×3 IMPLANT
PACK GENERAL/GYN (CUSTOM PROCEDURE TRAY) ×3 IMPLANT
PAD ARMBOARD 7.5X6 YLW CONV (MISCELLANEOUS) ×3 IMPLANT
SPECIMEN JAR X LARGE (MISCELLANEOUS) ×3 IMPLANT
SUT ETHILON 3 0 FSL (SUTURE) ×3 IMPLANT
SUT MNCRL AB 4-0 PS2 18 (SUTURE) ×3 IMPLANT
SUT VIC AB 3-0 SH 18 (SUTURE) ×6 IMPLANT
SYR CONTROL 10ML LL (SYRINGE) ×3 IMPLANT
TOWEL OR 17X24 6PK STRL BLUE (TOWEL DISPOSABLE) ×3 IMPLANT
TOWEL OR 17X26 10 PK STRL BLUE (TOWEL DISPOSABLE) ×3 IMPLANT

## 2016-01-11 NOTE — Interval H&P Note (Signed)
History and Physical Interval Note:  01/11/2016 10:41 AM  Theresa Zhang  has presented today for surgery, with the diagnosis of LEFT BREAST CANCER  The various methods of treatment have been discussed with the patient and family. After consideration of risks, benefits and other options for treatment, the patient has consented to  Procedure(s) with comments: [place] (Left) - RNFA as a surgical intervention .  The patient's history has been reviewed, patient examined, no change in status, stable for surgery.  I have reviewed the patient's chart and labs.  Questions were answered to the patient's satisfaction.     [name] A.

## 2016-01-11 NOTE — H&P (View-Only) (Signed)
Theresa Zhang 01/08/2016 10:48 AM Location: [facility] Patient #: [id number] DOB: 1950-11-05 Widowed / Language: Hindi / Race: Refused to Report/Unreported Female  History of Present Illness [name] A. Brixon Zhen MD; 01/08/2016 11:33 AM) Patient words: Patient seen at the request of Dr. Theda Sers for left breast cancer. She noticed to masses and pain in her left breast about 2 months ago. Mammogram revealed lesions in the left lower inner quadrant of the left breast each measuring roughly 1.7 cm. They're separated by 3 cm. Core biopsy showed invasive ductal carcinoma ER positive PR positive HER-2/neu negative for both. She is accompanied by her family. Patient's only complaint was left breast pain.     Per oncology   Theresa Zhang 65 y.o. female is here because of recent diagnosis of left breast cancer. Patient felt a lump about 6 weeks ago and was sent to undergo a mammogram. It revealed 2 nodules measuring 2 cm and 1.4 cm. 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 She was seen by Dr. Brantley Stage to discuss surgical options and she is here today to discuss a treatment plan with me. She is accompanied by her son. Patient speaks Hindi              Discussed with the patient, the details of pathology including the type of breast cancer,the clinical staging, the significance of ER, PR and HER-2/neu receptors and the implications for treatment. After reviewing the pathology in detail, we proceeded to discuss the different treatment options between surgery, radiation, chemotherapy, antiestrogen therapies.  Recommendation: 1. Axillary lymph node biopsy 2. If the lymph node is positive: Mammaprint testing on the biopsy to determine if she would need chemotherapy 3. If the lymph node is negative: Oncotype DX testing on the biopsy to determine if she would need  chemotherapy 4. Neoadjuvant therapy either with chemotherapy or antiestrogen therapy 5. Followed by surgery 6. Followed by radiation 7. Followed by antiestrogen therapy.  The patient is a 65 year old female.   Allergies ([name], CMA; 01/08/2016 10:49 AM) MetFORMIN HCl *ANTIDIABETICS*  Medication History ([name], CMA; 01/08/2016 10:49 AM) Losartan Potassium (25MG Tablet, Oral) Active. Metoprolol Succinate ER (25MG Tablet ER 24HR, Oral) Active. Meloxicam (15MG Tablet, Oral) Active. Glimepiride (4MG Tablet, Oral) Active. Medications Reconciled    Vitals ([name] CMA; 01/08/2016 10:49 AM) 01/08/2016 10:48 AM Weight: 172 lb Height: 59in Body Surface Area: 1.73 m Body Mass Index: 34.74 kg/m  Temp.: 46F(Temporal)  Pulse: 73 (Regular)  BP: 128/76 (Sitting, Left Arm, Standard)      Physical Exam (Shenika Quint A. Alainah Phang MD; 01/08/2016 11:34 AM)  General Mental Status-Alert. General Appearance-Consistent with stated age. Hydration-Well hydrated. Voice-Normal.  Breast Note: not repeated  Musculoskeletal Normal Exam - Left-Upper Extremity Strength Normal and Lower Extremity Strength Normal. Normal Exam - Right-Upper Extremity Strength Normal, Lower Extremity Weakness.    Assessment & Plan (Fermon Ureta A. Kathlynn Swofford MD; 01/08/2016 11:34 AM)  BREAST CANCER, LEFT (C50.912) Impression: Case discussed with Dr. Payton Mccallum of oncology. Patient desires left simple mastectomy with left sentinel lymph node mapping. She has no interest in reconstruction. I discussed this with the patient and family members today who helped to communicate with the patient. Questions were answered. Risk was reviewed as well as long-term expectations, treatments and possible complications. Discussed treatment options for breast cancer to include breast conservation vs mastectomy with reconstruction. Pt has decided on mastectomy. Risk include  bleeding, infection, flap necrosis, pain,  numbness, recurrence, hematoma, other surgery needs. Pt understands and agrees to proceed.  Current Plans You are being scheduled for surgery - Our schedulers will call you.  You should hear from our office's scheduling department within 5 working days about the location, date, and time of surgery. We try to make accommodations for patient's preferences in scheduling surgery, but sometimes the OR schedule or the surgeon's schedule prevents [place] from making those accommodations.  If you have not heard from our office [phone]) in 5 working days, call the office and ask for your surgeon's nurse.  If you have other questions about your diagnosis, plan, or surgery, call the office and ask for your surgeon's nurse.  Pt Education - [facility] Breast Cancer Information Given - Alight "Breast Journey" Package We discussed the staging and pathophysiology of breast cancer. We discussed all of the different options for treatment for breast cancer including surgery, chemotherapy, radiation therapy, Herceptin, and antiestrogen therapy. We discussed a sentinel lymph node biopsy as she does not appear to having lymph node involvement right now. We discussed the performance of that with injection of radioactive tracer and blue dye. We discussed that she would have an incision underneath her axillary hairline. We discussed that there is a bout a 10-20% chance of having a positive node with a sentinel lymph node biopsy and we will await the permanent pathology to make any other first further decisions in terms of her treatment. One of these options might be to return to the operating room to perform an axillary lymph node dissection. We discussed about a 1-2% risk lifetime of chronic shoulder pain as well as lymphedema associated with a sentinel lymph node biopsy. We discussed the options for treatment of the breast cancer which included lumpectomy versus a mastectomy. We discussed the performance of the lumpectomy with a  wire placement. We discussed a 10-20% chance of a positive margin requiring reexcision in the operating room. We also discussed that she may need radiation therapy or antiestrogen therapy or both if she undergoes lumpectomy. We discussed the mastectomy and the postoperative care for that as well. We discussed that there is no difference in her survival whether she undergoes lumpectomy with radiation therapy or antiestrogen therapy versus a mastectomy. There is a slight difference in the local recurrence rate being 3-5% with lumpectomy and about 1% with a mastectomy. We discussed the risks of operation including bleeding, infection, possible reoperation. She understands her further therapy will be based on what her stages at the time of her operation.  Pt Education - flb breast cancer surgery: discussed with patient and provided information. Pt Education - [facility] Mastectomy HCI Pt Education - ABC (After Breast Cancer) Class Info: discussed with patient and provided information.

## 2016-01-11 NOTE — Anesthesia Procedure Notes (Addendum)
Anesthesia Regional Block:  Pectoralis block  Pre-Anesthetic Checklist: ,, timeout performed, Correct Patient, Correct Site, Correct Laterality, Correct Procedure, Correct Position, site marked, Risks and benefits discussed,  Surgical consent,  Pre-op evaluation,  At surgeon's request and post-op pain management  Laterality: Left  Prep: chloraprep       Needles:  Injection technique: Single-shot  Needle Type: Echogenic Needle     Needle Length: 9cm 9 cm Needle Gauge: 21 and 21 G    Additional Needles:  Procedures: ultrasound guided (picture in chart) Pectoralis block Narrative:  Start time: 01/11/2016 10:30 AM End time: 01/11/2016 10:39 AM Injection made incrementally with aspirations every 5 mL.  Performed by: Personally  Anesthesiologist: Suzette Battiest

## 2016-01-11 NOTE — Op Note (Signed)
[facility] [place] 03-Apr-1951 [id number] 01/11/2016   Preoperative diagnosis: left breast cancer multifocal   Postoperative diagnosis: same   Procedure: left simple mastectomy and left sentinal lymph node mapping with methylene blue dye   Surgeon: Erroll Luna A.  Pensions consultant: none  Anesthesia:GA combined with regional for post-op pain  Clinical History and Indications:The patient is seen in the holding area and we reviewed the plans for the procedure as noted above. We reviewed the risks and complications a final time. She had no further questions. I marked theleft side as the operative side. A translator was available for assistance.   Description of Procedure: The patient was taken to the operating room. After satisfactory general anesthesia was obtained the timeout was done.   I then injected 5 cc of dilute methylene blue and injected it subareaorly and massaged it in. A full prep and drape was then done.  I outlined an elliptical incision and marked the inframammary fold and midline. The incision was made. The usual skin flaps were raised. I went to the clavicle superiorly and sternum medially and towards the latissimus laterally.    After the superior flap was complete I was able to use the neoprobe to locate a sentinel node.  2 hot and blue SLN were identified and removed level 1.    Once the nodes were removed it was forwarded to pathology.   The inferior flap was then made going to the inframammary fold and out to the latissimus. The breast was then removed from the pectoralis starting medially and working laterally. When I got to the lateral aspect of the pectoralis major muscle I opened the clavipectoral fascia. I finished removing the breast from the serratus muscles.   I therefore completed the mastectomy by dividing the remaining attachments from the breast to the chest wall and latissimus but not taking any more tissue from the axilla. The specimen was marked to  orient it for the pathologist and passed off the table.  I spent several minutes irrigating and making sure everything was dry. I then placed a 28 [place] Blake drain through a small incision in the inferior flap and laid along the pectoralis muscle. It was secured with a 2-0 nylon suture.  Another irrigation and check for hemostasis was made. Everything appeared to be dry. Incision was then closed with 3 0 vicryl and 4 0 monocryl.   The patient tolerated the procedure well. There were no operative complications. All counts were correct. Estimated blood loss was 50 cc . Sterile dressings were applied and the patient taken to the PACU in satisfactory condition.  Turner Daniels, MD, FACS 01/11/2016 12:39 PM

## 2016-01-11 NOTE — Transfer of Care (Signed)
Immediate Anesthesia Transfer of Care Note  Patient: Theresa Zhang  Procedure(s) Performed: Procedure(s) with comments: MASTECTOMY WITH SENTINEL LYMPH NODE BIOPSY (Left) - RNFA  Patient Location: PACU  Anesthesia Type:General and Regional  Level of Consciousness: awake, alert , oriented and patient cooperative  Airway & Oxygen Therapy: Patient Spontanous Breathing and Patient connected to face mask oxygen  Post-op Assessment: Report given to RN and Post -op Vital signs reviewed and stable  Post vital signs: Reviewed and stable  Last Vitals:  Vitals:   01/11/16 1040 01/11/16 1045  BP: (!) 167/84 139/61  Pulse: 72 81  Resp: 18 (!) 23  Temp:      Last Pain:  Vitals:   01/11/16 0836  TempSrc: Oral         Complications: No apparent anesthesia complications

## 2016-01-11 NOTE — Anesthesia Procedure Notes (Signed)
Procedure Name: LMA Insertion Date/Time: 01/11/2016 11:12 AM Performed by: Shirlyn Goltz Pre-anesthesia Checklist: Patient identified, Emergency Drugs available, Suction available and Patient being monitored Patient Re-evaluated:Patient Re-evaluated prior to inductionOxygen Delivery Method: Circle system utilized Preoxygenation: Pre-oxygenation with 100% oxygen Intubation Type: IV induction LMA: LMA inserted LMA Size: 4.0 Number of attempts: 1 Placement Confirmation: positive ETCO2 and breath sounds checked- equal and bilateral Tube secured with: Tape Dental Injury: Teeth and Oropharynx as per pre-operative assessment

## 2016-01-11 NOTE — Anesthesia Postprocedure Evaluation (Signed)
Anesthesia Post Note  Patient: Theresa Zhang  Procedure(s) Performed: Procedure(s) (LRB): MASTECTOMY WITH SENTINEL LYMPH NODE BIOPSY (Left)  Patient location during evaluation: PACU Anesthesia Type: General and Regional Level of consciousness: awake and alert Pain management: pain level controlled Vital Signs Assessment: post-procedure vital signs reviewed and stable Respiratory status: spontaneous breathing, nonlabored ventilation, respiratory function stable and patient connected to nasal cannula oxygen Cardiovascular status: blood pressure returned to baseline and stable Postop Assessment: no signs of nausea or vomiting Anesthetic complications: no    Last Vitals:  Vitals:   01/11/16 1320 01/11/16 1346  BP: (!) 148/78 137/63  Pulse: 70 67  Resp: 16 18  Temp: 36.3 C 36.1 C    Last Pain:  Vitals:   01/11/16 1429  TempSrc:   PainSc: Tyler Deis

## 2016-01-11 NOTE — Anesthesia Preprocedure Evaluation (Addendum)
Anesthesia Evaluation  Patient identified by MRN, date of birth, ID band Patient awake    Reviewed: Allergy & Precautions, NPO status , Patient's Chart, lab work & pertinent test results, reviewed documented beta blocker date and time   Airway Mallampati: III  TM Distance: >3 FB Neck ROM: Full    Dental  (+) Dental Advisory Given   Pulmonary neg pulmonary ROS,    breath sounds clear to auscultation       Cardiovascular hypertension, Pt. on medications and Pt. on home beta blockers  Rhythm:Regular Rate:Normal     Neuro/Psych negative neurological ROS     GI/Hepatic negative GI ROS, Neg liver ROS,   Endo/Other  diabetes, Type 2, Oral Hypoglycemic Agents  Renal/GU negative Renal ROS     Musculoskeletal  (+) Arthritis ,   Abdominal   Peds  Hematology negative hematology ROS (+)   Anesthesia Other Findings   Reproductive/Obstetrics                            Lab Results  Component Value Date   WBC 7.5 01/11/2016   HGB 12.0 01/11/2016   HCT 39.0 01/11/2016   MCV 82.3 01/11/2016   PLT 290 01/11/2016   Lab Results  Component Value Date   CREATININE 0.87 01/11/2016   BUN 26 (H) 01/11/2016   NA 140 01/11/2016   K 4.5 01/11/2016   CL 107 01/11/2016   CO2 25 01/11/2016    Anesthesia Physical Anesthesia Plan  ASA: II  Anesthesia Plan: General and Regional   Post-op Pain Management: GA combined w/ Regional for post-op pain   Induction: Intravenous  Airway Management Planned: LMA  Additional Equipment:   Intra-op Plan:   Post-operative Plan: Extubation in OR  Informed Consent: I have reviewed the patients History and Physical, chart, labs and discussed the procedure including the risks, benefits and alternatives for the proposed anesthesia with the patient or authorized representative who has indicated his/her understanding and acceptance.   Dental advisory given  Plan  Discussed with: CRNA  Anesthesia Plan Comments:         Anesthesia Quick Evaluation

## 2016-01-12 ENCOUNTER — Encounter (HOSPITAL_COMMUNITY): Payer: Self-pay | Admitting: Surgery

## 2016-01-12 LAB — GLUCOSE, CAPILLARY: GLUCOSE-CAPILLARY: 84 mg/dL (ref 65–99)

## 2016-01-12 MED ORDER — ONDANSETRON 4 MG PO TBDP: 4.0000 mg | ORAL_TABLET | Freq: Four times a day (QID) | ORAL | 0 refills | Status: DC | PRN

## 2016-01-12 MED ORDER — OXYCODONE HCL 5 MG PO TABS: 5.0000 mg | ORAL_TABLET | ORAL | 0 refills | Status: DC | PRN

## 2016-01-12 MED ORDER — METHOCARBAMOL 500 MG PO TABS: 500.0000 mg | ORAL_TABLET | Freq: Four times a day (QID) | ORAL | 0 refills | Status: DC | PRN

## 2016-01-12 MED ORDER — POLYETHYLENE GLYCOL 3350 17 G PO PACK: 17.0000 g | PACK | Freq: Every day | ORAL | 0 refills | Status: DC | PRN

## 2016-01-12 MED FILL — METHOCARBAMOL 500 MG TABLET: 500 | 7 days supply | Qty: 30 | Fill #0

## 2016-01-12 MED FILL — POLYETHYLENE GLYCOL 3350: 15 days supply | Qty: 255 | Fill #0

## 2016-01-12 MED FILL — ONDANSETRON ODT 4 MG TABLET: 4 | 5 days supply | Qty: 20 | Fill #0

## 2016-01-12 NOTE — Progress Notes (Signed)
Patient discharged home with instructions given to family member.

## 2016-01-12 NOTE — Discharge Instructions (Signed)
Bulb Drain Home Care A bulb drain consists of a thin rubber tube and a soft, round bulb that creates a gentle suction. The rubber tube is placed in the area where you had surgery. A bulb is attached to the end of the tube that is outside the body. The bulb drain removes excess fluid that normally builds up in a surgical wound after surgery. The color and amount of fluid will vary. Immediately after surgery, the fluid is bright red and is a little thicker than water. It may gradually change to a yellow or pink color and become more thin and water-like. When the amount decreases to about 1 or 2 tbsp in 24 hours, your health care provider will usually remove it. DAILY CARE  Keep the bulb flat (compressed) at all times, except while emptying it. The flatness creates suction. You can flatten the bulb by squeezing it firmly in the middle and then closing the cap.  Keep sites where the tube enters the skin dry and covered with a bandage (dressing).  Secure the tube 1-2 in (2.5-5.1 cm) below the insertion sites to keep it from pulling on your stitches. The tube is stitched in place and will not slip out.  Secure the bulb as directed by your health care provider.  For the first 3 days after surgery, there usually is more fluid in the bulb. Empty the bulb whenever it becomes half full because the bulb does not create enough suction if it is too full. The bulb could also overflow. Write down how much fluid you remove each time you empty your drain. Add up the amount removed in 24 hours.  Empty the bulb at the same time every day once the amount of fluid decreases and you only need to empty it once a day. Write down the amounts and the 24-hour totals to give to your health care provider. This helps your health care provider know when the tubes can be removed. EMPTYING THE BULB DRAIN Before emptying the bulb, get a measuring cup, a piece of paper and a pen, and wash your hands.  Gently run your fingers down the  tube (stripping) to empty any drainage from the tubing into the bulb. This may need to be done several times a day to clear the tubing of clots and tissue.  Open the bulb cap to release suction, which causes it to inflate. Do not touch the inside of the cap.  Gently run your fingers down the tube (stripping) to empty any drainage from the tubing into the bulb.  Hold the cap out of the way, and pour fluid into the measuring cup.   Squeeze the bulb to provide suction.  Replace the cap.   Check the tape that holds the tube to your skin. If it is becoming loose, you can remove the loose piece of tape and apply a new one. Then, pin the bulb to your shirt.   Write down the amount of fluid you emptied out. Write down the date and each time you emptied your bulb drain. (If there are 2 bulbs, note the amount of drainage from each bulb and keep the totals separate. Your health care provider will want to know the total amounts for each drain and which tube is draining more.)   Flush the fluid down the toilet and wash your hands.   Call your health care provider once you have less than 2 tbsp of fluid collecting in the bulb drain every 24 hours. If  there is drainage around the tube site, change dressings and keep the area dry. Cleanse around tube with sterile saline and place dry gauze around site. This gauze should be changed when it is soiled. If it stays clean and unsoiled, it should still be changed daily.  SEEK MEDICAL CARE IF:  Your drainage has a bad smell or is cloudy.   You have a fever.   Your drainage is increasing instead of decreasing.   Your tube fell out.   You have redness or swelling around the tube site.   You have drainage from a surgical wound.   Your bulb drain will not stay flat after you empty it.  MAKE SURE YOU:   Understand these instructions.  Will watch your condition.  Will get help right away if you are not doing well or get worse.   This  information is not intended to replace advice given to you by your health care provider. Make sure you discuss any questions you have with your health care provider.   Document Released: 04/12/2000 Document Revised: 05/06/2014 Document Reviewed: 11/02/2014 Elsevier Interactive Patient Education 2016 [employer]. [facility], [place] [phone]  MASTECTOMY: POST OP INSTRUCTIONS  Always review your discharge instruction sheet given to you by the facility where your surgery was performed. IF YOU HAVE DISABILITY OR FAMILY LEAVE FORMS, YOU MUST BRING THEM TO THE OFFICE FOR PROCESSING.   DO NOT GIVE THEM TO YOUR DOCTOR. A prescription for pain medication may be given to you upon discharge.  Take your pain medication as prescribed, if needed.  If narcotic pain medicine is not needed, then you may take acetaminophen (Tylenol) or ibuprofen (Advil) as needed. 1. Take your usually prescribed medications unless otherwise directed. 2. If you need a refill on your pain medication, please contact your pharmacy.  They will contact our office to request authorization.  Prescriptions will not be filled after 5pm or on week-ends. 3. You should follow a light diet the first few days after arrival home, such as soup and crackers, etc.  Resume your normal diet the day after surgery. 4. Most patients will experience some swelling and bruising on the chest and underarm.  Ice packs will help.  Swelling and bruising can take several days to resolve.  5. It is common to experience some constipation if taking pain medication after surgery.  Increasing fluid intake and taking a stool softener (such as Colace) will usually help or prevent this problem from occurring.  A mild laxative (Milk of Magnesia or Miralax) should be taken according to package instructions if there are no bowel movements after 48 hours. 6. Unless discharge instructions indicate otherwise, leave your bandage dry and in place until your  next appointment in 3-5 days.  You may take a limited sponge bath.  No tube baths or showers until the drains are removed.  You may have steri-strips (small skin tapes) in place directly over the incision.  These strips should be left on the skin for 7-10 days.  If your surgeon used skin glue on the incision, you may shower in 24 hours.  The glue will flake off over the next 2-3 weeks.  Any sutures or staples will be removed at the office during your follow-up visit. 7. DRAINS:  If you have drains in place, it is important to keep a list of the amount of drainage produced each day in your drains.  Before leaving the hospital, you should be instructed on drain care.  Call  our office if you have any questions about your drains. 8. ACTIVITIES:  You may resume regular (light) daily activities beginning the next day--such as daily self-care, walking, climbing stairs--gradually increasing activities as tolerated.  You may have sexual intercourse when it is comfortable.  Refrain from any heavy lifting or straining until approved by your doctor. a. You may drive when you are no longer taking prescription pain medication, you can comfortably wear a seatbelt, and you can safely maneuver your car and apply brakes. b. RETURN TO WORK:  __________________________________________________________ 9. You should see your doctor in the office for a follow-up appointment approximately 3-5 days after your surgery.  Your doctors nurse will typically make your follow-up appointment when she calls you with your pathology report.  Expect your pathology report 2-3 business days after your surgery.  You may call to check if you do not hear from [place] after three days.   10. OTHER INSTRUCTIONS: ______________________________________________________________________________________________ ____________________________________________________________________________________________ WHEN TO CALL YOUR DOCTOR: 1. Fever over 101.0 2. Nausea  and/or vomiting 3. Extreme swelling or bruising 4. Continued bleeding from incision. 5. Increased pain, redness, or drainage from the incision. The clinic staff is available to answer your questions during regular business hours.  Please dont hesitate to call and ask to speak to one of the nurses for clinical concerns.  If you have a medical emergency, go to the nearest emergency room or call 911.  A surgeon from [facility] is always on call at the hospital. [address] [place], [place], [place]  [address] ? [address], [place], [place]   [address] [phone] ? [phone] ? FAX [phone] Web site: [email or web address]

## 2016-01-12 NOTE — Discharge Summary (Signed)
Physician Discharge Summary  Patient ID: Theresa Zhang MRN: [id number] DOB/AGE: 09-27-1950 65 y.o.  Admit date: 01/11/2016 Discharge date: 01/12/2016  Admission [place] [facility]  Discharge Diagnoses:  Active Problems:   Breast cancer of upper-outer quadrant of left female breast [facility])   Discharged Condition: good  Hospital Course: Pt did well Good pain control Ambulated  Without difficulty Tolerating her diet Flaps viable and no hematoma.   Consults: None   Treatments: surgery: left mastectomy  And SLN mapping   Discharge Exam: Blood pressure (!) 136/57, pulse 74, temperature 98.5 F (36.9 C), temperature source Oral, resp. rate 18, height 4\' 10"  (1.473 m), weight 75 kg (165 lb 5.5 oz), SpO2 95 %. Incision/Wound:CDI no hematoma  JP drainage bloody but not excessive  Flaps viable  Disposition: Final discharge disposition not confirmed  Discharge Instructions    Call MD for:  redness, tenderness, or signs of infection (pain, swelling, redness, odor or green/yellow discharge around incision site)    Complete by:  As directed    Call MD for:  severe uncontrolled pain    Complete by:  As directed    Call MD for:  temperature >100.4    Complete by:  As directed    Diet - low sodium heart healthy    Complete by:  As directed    Increase activity slowly    Complete by:  As directed        Medication List    TAKE these medications   anastrozole 1 MG tablet Commonly known as:  ARIMIDEX Take 1 tablet (1 mg total) by mouth daily.   glimepiride 4 MG tablet Commonly known as:  AMARYL Take 4 mg by mouth daily. Before breakfast   losartan 25 MG tablet Commonly known as:  COZAAR Take 50 mg by mouth daily.   meloxicam 15 MG tablet Commonly known as:  MOBIC Take 15 mg by mouth daily.   methocarbamol 500 MG tablet Commonly known as:  ROBAXIN Take 1 tablet (500 mg total) by mouth every 6 (six) hours as needed for muscle spasms.   metoprolol succinate 25  MG 24 hr tablet Commonly known as:  TOPROL-XL Take 25 mg by mouth daily.   ondansetron 4 MG disintegrating tablet Commonly known as:  ZOFRAN-ODT Take 1 tablet (4 mg total) by mouth every 6 (six) hours as needed for nausea.   oxyCODONE 5 MG immediate release tablet Commonly known as:  Oxy IR/ROXICODONE Take 1-2 tablets (5-10 mg total) by mouth every 4 (four) hours as needed for moderate pain.   polyethylene glycol packet Commonly known as:  MIRALAX / GLYCOLAX Take 17 g by mouth daily as needed for mild constipation.        Signed: Aspen Deterding A. 01/12/2016, 8:41 AM

## 2016-01-22 ENCOUNTER — Ambulatory Visit: Payer: No Typology Code available for payment source | Admitting: Hematology and Oncology

## 2016-01-22 NOTE — Assessment & Plan Note (Deleted)
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 I discussed the margins as well as lymph node surgeries. We also discussed the final staging along with previously performed ER/PR and HER-2/neu testing.  Recommendation: 1. Oncotype DX testing to determine if she would benefit from systemic chemotherapy 2. followed by adjuvant antiestrogen therapy with anastrozole 1 mg daily 5 years (patient currently has anastrozole tablets and will start taking it back pain)  Oncotype counseling: I discussed Oncotype DX test. I explained to the patient that this is a 21 gene panel to evaluate patient tumors DNA to calculate recurrence score. This would help determine whether patient has high risk or intermediate risk or low risk breast cancer. She understands that if her tumor was found to be high risk, she would benefit from systemic chemotherapy. If low risk, no need of chemotherapy. If she was found to be intermediate risk, we would need to evaluate the score as well as other risk factors and determine if an abbreviated chemotherapy may be of benefit.  Return to clinic based upon Oncotype DX testing Otherwise I will see the patient back in 6 months for follow-up

## 2016-01-24 ENCOUNTER — Telehealth: Payer: Self-pay | Admitting: Hematology and Oncology

## 2016-01-24 NOTE — Telephone Encounter (Signed)
Used language line interpreter [phone] to inform pt of 10/3 appt per LOS

## 2016-01-29 NOTE — Assessment & Plan Note (Signed)
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 I discussed the margins as well as lymph node surgeries. We also discussed the final staging along with previously performed ER/PR and HER-2/neu testing.  Recommendation: 1. Oncotype DX testing to determine if she would benefit from systemic chemotherapy 2. followed by adjuvant antiestrogen therapy with anastrozole 1 mg daily 5 years (patient currently has anastrozole tablets and will start taking it back pain)  Oncotype counseling: I discussed Oncotype DX test. I explained to the patient that this is a 21 gene panel to evaluate patient tumors DNA to calculate recurrence score. This would help determine whether patient has high risk or intermediate risk or low risk breast cancer. She understands that if her tumor was found to be high risk, she would benefit from systemic chemotherapy. If low risk, no need of chemotherapy. If she was found to be intermediate risk, we would need to evaluate the score as well as other risk factors and determine if an abbreviated chemotherapy may be of benefit.  Return to clinic based upon Oncotype DX testing Otherwise I will see the patient back in 6 months for follow-up

## 2016-01-30 ENCOUNTER — Encounter: Payer: Self-pay | Admitting: Hematology and Oncology

## 2016-01-30 ENCOUNTER — Ambulatory Visit (HOSPITAL_BASED_OUTPATIENT_CLINIC_OR_DEPARTMENT_OTHER): Payer: Self-pay | Admitting: Hematology and Oncology

## 2016-01-30 DIAGNOSIS — C50512 Malignant neoplasm of lower-outer quadrant of left female breast: Secondary | ICD-10-CM

## 2016-01-30 DIAGNOSIS — Z170 Estrogen receptor positive status [ER+]: Secondary | ICD-10-CM

## 2016-01-30 NOTE — Progress Notes (Signed)
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 She is recovering very well from the surgery. She has a leakage from the mastectomy site because of drain got pulled in the middle of the night. She is waiting for it to dry out. Other than that she does not take any pain medications.  REVIEW OF SYSTEMS:   Constitutional: Denies fevers, chills or abnormal weight loss Eyes: Denies blurriness of vision Ears, nose, mouth, throat, and face: Denies mucositis or sore throat Respiratory: Denies cough, dyspnea or wheezes Cardiovascular: Denies palpitation, chest discomfort Gastrointestinal:  Denies nausea, heartburn or change in bowel habits Skin: Denies abnormal skin rashes Lymphatics: Denies new lymphadenopathy or easy bruising Neurological:Denies numbness, tingling or new weaknesses Behavioral/Psych: Mood is stable, no new changes  Extremities: No lower extremity edema Breast: Left mastectomy All other systems were reviewed with the  patient and are negative.  I have reviewed the past medical history, past surgical history, social history and family history with the patient and they are unchanged from previous note.  ALLERGIES:  is allergic to metformin.  MEDICATIONS:  Current Outpatient Prescriptions  Medication Sig Dispense Refill  . anastrozole (ARIMIDEX) 1 MG tablet Take 1 tablet (1 mg total) by mouth daily. 90 tablet 3  . glimepiride (AMARYL) 4 MG tablet Take 4 mg by mouth daily. Before breakfast    . losartan (COZAAR) 25 MG tablet Take 50 mg by mouth daily.    . meloxicam (MOBIC) 15 MG tablet Take 15 mg by mouth daily.    . methocarbamol (ROBAXIN) 500 MG tablet Take 1 tablet (500 mg total) by mouth every 6 (six) hours as needed for muscle spasms. 30 tablet 0  . metoprolol succinate (TOPROL-XL) 25 MG 24 hr tablet Take 25 mg by mouth daily.    . ondansetron (ZOFRAN-ODT) 4 MG disintegrating tablet Take 1 tablet (4 mg total) by mouth every 6 (six) hours as needed for nausea. 20 tablet 0  . oxyCODONE (OXY IR/ROXICODONE) 5 MG immediate release tablet Take 1-2 tablets (5-10 mg total) by mouth every 4 (four) hours as needed for moderate pain. 30 tablet 0  . polyethylene glycol (MIRALAX / GLYCOLAX) packet Take 17 g by mouth daily as needed for mild constipation. 14 each 0   No current facility-administered medications for this visit.     PHYSICAL EXAMINATION: ECOG PERFORMANCE STATUS: 1 - Symptomatic but completely  ambulatory  Vitals:   01/30/16 1505  BP: (!) 132/53  Pulse: 69  Resp: 18  Temp: 97.9 F (36.6 C)   Filed Weights   01/30/16 1505  Weight: 171 lb 6.4 oz (77.7 kg)    GENERAL:alert, no distress and comfortable SKIN: skin color, texture, turgor are normal, no rashes or significant lesions EYES: normal, Conjunctiva are pink and non-injected, sclera clear OROPHARYNX:no exudate, no erythema and lips, buccal mucosa, and tongue normal  NECK: supple, thyroid normal size, non-tender, without  nodularity LYMPH:  no palpable lymphadenopathy in the cervical, axillary or inguinal LUNGS: clear to auscultation and percussion with normal breathing effort HEART: regular rate & rhythm and no murmurs and no lower extremity edema ABDOMEN:abdomen soft, non-tender and normal bowel sounds MUSCULOSKELETAL:no cyanosis of digits and no clubbing  NEURO: alert & oriented x 3 with fluent speech, no focal motor/sensory deficits EXTREMITIES: No lower extremity edema   LABORATORY DATA:  I have reviewed the data as listed   Chemistry      Component Value Date/Time   NA 140 01/11/2016 0926   K 4.5 01/11/2016 0926   CL 107 01/11/2016 0926   CO2 25 01/11/2016 0926   BUN 26 (H) 01/11/2016 0926   CREATININE 0.87 01/11/2016 0926      Component Value Date/Time   CALCIUM 9.3 01/11/2016 0926   ALKPHOS 80 01/11/2016 0926   AST 39 01/11/2016 0926   ALT 35 01/11/2016 0926   BILITOT 0.5 01/11/2016 0926       Lab Results  Component Value Date   WBC 7.5 01/11/2016   HGB 12.0 01/11/2016   HCT 39.0 01/11/2016   MCV 82.3 01/11/2016   PLT 290 01/11/2016   NEUTROABS 5.1 01/11/2016     ASSESSMENT & PLAN:  Breast cancer of lower-outer quadrant of left female breast ([place]) Left mastectomy 01/11/2016: IDC with DCIS, 2 tumors, 2 cm and 1.8 cm, 0/2 lymph nodes negative, T1CmN0 stage IA, grade 2, ER 90%, 90%; PR 95%, 95%; HER-2 negative ratio 1.35, 1.33; Ki-67 5%, 3% Patient was diagnosed July 2017 and was treated with neoadjuvant anastrozole for 2 months prior to surgery  Pathology counseling: I discussed the final pathology report of the patient provided  a copy of this report. I discussed the margins as well as lymph node surgeries. We also discussed the final staging along with previously performed ER/PR and HER-2/neu testing.  Recommendation: 1. Oncotype DX testing to determine if she would benefit from systemic chemotherapy 2. followed by adjuvant antiestrogen therapy with anastrozole 1 mg daily 5  years (patient currently has anastrozole tablets and will start taking it back pain)  Oncotype counseling: I discussed Oncotype DX test. I explained to the patient that this is a 21 gene panel to evaluate patient tumors DNA to calculate recurrence score. This would help determine whether patient has high risk or intermediate risk or low risk breast cancer. She understands that if her tumor was found to be high risk, she would benefit from systemic chemotherapy. If low risk, no need of chemotherapy. If she was found to be intermediate risk, we would need to evaluate the score as well as other risk factors and determine if an abbreviated chemotherapy may be of benefit.  Return to clinic based upon Oncotype DX testing Otherwise I will see the patient back in 6 months for follow-up     No orders of the defined types were placed in this encounter.  The patient has a good understanding of the overall plan.  she agrees with it. she will call with any problems that may develop before the next visit here.   Rulon Eisenmenger, MD 01/30/16

## 2016-01-31 ENCOUNTER — Telehealth: Payer: Self-pay | Admitting: *Deleted

## 2016-01-31 NOTE — Telephone Encounter (Signed)
Received order per Dr Gudena for oncotype testing. Requisition sent to pathology. Received by Tammy 

## 2016-02-01 ENCOUNTER — Telehealth: Payer: Self-pay

## 2016-02-01 NOTE — Telephone Encounter (Signed)
Financial hardship form received via fax and given to Dr. Lindi Adie to sign and complete as directed.

## 2016-02-01 NOTE — Telephone Encounter (Signed)
[place] from [employer] [facility] called requesting information if pt is insured or uninsured. Pt is uninsured. [employer] will fax an economic hardship form for MD to sign. Dr Geralyn Flash fax given to [employer].

## 2016-02-13 ENCOUNTER — Encounter (HOSPITAL_COMMUNITY): Payer: Self-pay

## 2016-02-13 ENCOUNTER — Other Ambulatory Visit: Payer: Self-pay | Admitting: Hematology and Oncology

## 2016-02-13 ENCOUNTER — Telehealth: Payer: Self-pay | Admitting: Hematology and Oncology

## 2016-02-13 MED ORDER — ANASTROZOLE 1 MG PO TABS: 1.0000 mg | ORAL_TABLET | Freq: Every day | ORAL | 3 refills | Status: DC

## 2016-02-13 NOTE — Telephone Encounter (Signed)
I called the patient's son with them the result of Oncotype DX score of 4 which translates into a 4% risk of recurrence with tamoxifen. This is low risk disease and hence she does not need systemic chemotherapy. I sent a prescription for [facility]astrozole [facility] in [place]. I sent a request to our schedulers to  place an appointment for her to come back to see me in 4 months.

## 2016-02-19 ENCOUNTER — Telehealth: Payer: Self-pay | Admitting: *Deleted

## 2016-02-19 NOTE — Telephone Encounter (Signed)
Received Oncotype Dx results of 4/4%.  Placed a copy on Dr. Geralyn Flash desk, gave Varney Biles a copy and took a copy to HIM to scan.

## 2016-02-20 MED FILL — ANASTROZOLE 1 MG TABLET: 1 | 90 days supply | Qty: 90 | Fill #1

## 2016-06-17 ENCOUNTER — Ambulatory Visit: Payer: No Typology Code available for payment source | Admitting: Hematology and Oncology

## 2016-06-24 MED FILL — ANASTROZOLE 1 MG TABLET: 1 | 90 days supply | Qty: 90 | Fill #2

## 2016-07-26 ENCOUNTER — Encounter: Payer: Self-pay | Admitting: Adult Health

## 2016-09-30 MED FILL — ANASTROZOLE 1 MG TABLET: 1 | 90 days supply | Qty: 90 | Fill #3

## 2016-12-24 ENCOUNTER — Other Ambulatory Visit: Payer: Self-pay | Admitting: Hematology and Oncology

## 2016-12-24 MED FILL — ANASTROZOLE 1 MG TABLET: 1 | 90 days supply | Qty: 90 | Fill #0

## 2016-12-31 ENCOUNTER — Telehealth: Payer: Self-pay | Admitting: Hematology and Oncology

## 2016-12-31 ENCOUNTER — Encounter: Payer: Self-pay | Admitting: Hematology and Oncology

## 2016-12-31 ENCOUNTER — Ambulatory Visit (HOSPITAL_BASED_OUTPATIENT_CLINIC_OR_DEPARTMENT_OTHER): Payer: Medicaid Other | Admitting: Hematology and Oncology

## 2016-12-31 ENCOUNTER — Other Ambulatory Visit: Payer: Self-pay | Admitting: Hematology and Oncology

## 2016-12-31 DIAGNOSIS — C50512 Malignant neoplasm of lower-outer quadrant of left female breast: Secondary | ICD-10-CM | POA: Diagnosis present

## 2016-12-31 DIAGNOSIS — Z170 Estrogen receptor positive status [ER+]: Secondary | ICD-10-CM

## 2016-12-31 DIAGNOSIS — Z1231 Encounter for screening mammogram for malignant neoplasm of breast: Secondary | ICD-10-CM

## 2016-12-31 DIAGNOSIS — Z79811 Long term (current) use of aromatase inhibitors: Secondary | ICD-10-CM

## 2016-12-31 NOTE — Progress Notes (Signed)
Patient Care Team: System, Pcp Not In as PCP - General Nicholas Lose, MD as Consulting Physician (Hematology and Oncology) Delice Bison, Charlestine Massed, NP as Nurse Practitioner (Hematology and Oncology) Erroll Luna, MD as Consulting Physician (General Surgery)  DIAGNOSIS:  Encounter Diagnosis  Name Primary?  . 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, ER 90%, PR 95%, Ki-67 3%, HER-2 negative ratio 1.33, left breast biopsy 5:30 position: Grade 2-3 IDC, DCIS, LVI, ER 90%, PR 95%, Ki-67 5%, HER-2 negative ratio 1.35      01/11/2016 Surgery    Left mastectomy: IDC with DCIS, 2 tumors, 2 cm and 1.8 cm, 0/2 lymph nodes negative, T1CmN0 stage IA, grade 2, ER 90%, 90%; PR 95%, 95%; HER-2 negative ratio 1.35, 1.33; Ki-67 5%, 3%      01/2016 -  Anti-estrogen oral therapy    Anastrozole 54m daily      01/31/2016 Oncotype testing    Oncotype: 4/4%       CHIEF COMPLIANT: Follow-up on anastrozole therapy  INTERVAL HISTORY: Theresa Zhang a 66year old above-mentioned history left breast cancer treated with mastectomy and is currently on anastrozole therapy. Overall she appears to be tolerating anastrozole fairly well. She does complain of her trigger finger. There is also some pain in the hand in the tendons. She also complains of tightness in the chest with some discomfort in the left axilla especially when she exercises. She is also gained some weight. Her son reports that she sleeps at 3 AM in the morning and does not eat until 2 PM and  after known. She tells me that she cannot go to sleep easily and that's why she sleeps late. It appears that she is watching TV all night.  REVIEW OF SYSTEMS:   Constitutional: Denies fevers, chills or abnormal weight loss Eyes: Denies blurriness of vision Ears, nose, mouth, throat, and face: Denies mucositis or sore throat Respiratory: Denies cough, dyspnea or wheezes Cardiovascular: Denies palpitation, chest discomfort Gastrointestinal:  Denies nausea, heartburn or change in bowel habits Skin: Denies abnormal skin rashes Lymphatics: Denies new lymphadenopathy or easy bruising Neurological:Denies numbness, tingling or new weaknesses Behavioral/Psych: Mood is stable, no new changes  Extremities: No lower extremity edema Breast: Left mastectomy with chest discomfort All other systems were reviewed with the patient and are negative.  I have reviewed the past medical history, past surgical history, social history and family history with the patient and they are unchanged from previous note.  ALLERGIES:  is allergic to metformin.  MEDICATIONS:  Current Outpatient Prescriptions  Medication Sig Dispense Refill  . anastrozole (ARIMIDEX) 1 MG tablet TAKE 1 TABLET BY MOUTH DAILY. 90 tablet 3  . glimepiride (AMARYL) 4 MG tablet Take 4 mg by mouth daily. Before breakfast    . losartan (COZAAR) 25 MG tablet Take 50 mg by mouth daily.    . meloxicam (MOBIC) 15 MG tablet Take 15 mg by mouth daily.    . methocarbamol (ROBAXIN) 500 MG tablet Take  1 tablet (500 mg total) by mouth every 6 (six) hours as needed for muscle spasms. 30 tablet 0  . metoprolol succinate (TOPROL-XL) 25 MG 24 hr tablet Take 25 mg by mouth daily.     No current facility-administered medications for this visit.     PHYSICAL EXAMINATION: ECOG PERFORMANCE STATUS: 1 - Symptomatic but completely ambulatory  Vitals:   12/31/16 1530  BP: (!) 173/70  Pulse: 81  Resp: 18  Temp: 98.2 F (36.8 C)  SpO2: 100%   Filed Weights     12/31/16 1530  Weight: 180 lb 4.8 oz (81.8 kg)    GENERAL:alert, no distress and comfortable SKIN: skin color, texture, turgor are normal, no rashes or significant lesions EYES: normal, Conjunctiva are pink and non-injected, sclera clear OROPHARYNX:no exudate, no erythema and lips, buccal mucosa, and tongue normal  NECK: supple, thyroid normal size, non-tender, without nodularity LYMPH:  no palpable lymphadenopathy in the cervical, axillary or inguinal LUNGS: clear to auscultation and percussion with normal breathing effort HEART: regular rate & rhythm and no murmurs and no lower extremity edema ABDOMEN:abdomen soft, non-tender and normal bowel sounds MUSCULOSKELETAL:no cyanosis of digits and no clubbing  NEURO: alert & oriented x 3 with fluent speech, no focal motor/sensory deficits EXTREMITIES: No lower extremity edema BREAST left mastectomy scar is intact without any palpable lumps or nodules. No palpable lumps or nodules in the right breast or axilla. (exam performed in the presence of a chaperone)  LABORATORY DATA:  I have reviewed the data as listed   Chemistry      Component Value Date/Time   NA 140 01/11/2016 0926   K 4.5 01/11/2016 0926   CL 107 01/11/2016 0926   CO2 25 01/11/2016 0926   BUN 26 (H) 01/11/2016 0926   CREATININE 0.87 01/11/2016 0926      Component Value Date/Time   CALCIUM 9.3 01/11/2016 0926   ALKPHOS 80 01/11/2016 0926   AST 39 01/11/2016 0926   ALT 35 01/11/2016 0926   BILITOT 0.5 01/11/2016 0926       Lab Results  Component Value Date   WBC 7.5 01/11/2016   HGB 12.0 01/11/2016   HCT 39.0 01/11/2016   MCV 82.3 01/11/2016   PLT 290 01/11/2016   NEUTROABS 5.1 01/11/2016    ASSESSMENT & PLAN:  Breast cancer of lower-outer quadrant of left female breast ([place]) Left mastectomy 01/11/2016: IDC with DCIS, 2 tumors, 2 cm and 1.8 cm, 0/2 lymph nodes negative, T1CmN0 stage IA, grade 2, ER 90%, 90%; PR 95%, 95%; HER-2 negative ratio 1.35, 1.33;  Ki-67 5%, 3% Patient was diagnosed July 2017 and was treated with neoadjuvant anastrozole for 2 months prior to surgery Oncotype DX score for: ROR 4%  Current treatment: Anastrozole 1 mg daily started 02/13/2016 Lifestyle modifications: I discussed with her about having a healthy lifestyle with the sleeping early in the night and eating food at regular intervals. I discussed the fact that she needs to exercise regularly and stop the weight gain.  Anastrozole toxicities: Patient will need right breast mammograms for surveillance. Return to clinic in 1 year for follow-up  I spent 25 minutes talking to the patient of which more than half was spent in counseling and coordination of care.  Orders Placed This Encounter  Procedures  . MM DIAG BREAST TOMO UNI RIGHT    Standing Status:   Future    Standing Expiration Date:   12/31/2017    Order Specific Question:   Reason for Exam (SYMPTOM  OR DIAGNOSIS REQUIRED)    Answer:   rt breast mammogram with history of left breast cancer    Order Specific Question:   Preferred imaging location?    Answer:   [facility]   The patient has a good understanding of the overall plan. she agrees with it. she will call with any problems that may develop before the next visit here.   Theresa Eisenmenger, MD 12/31/16

## 2016-12-31 NOTE — Telephone Encounter (Signed)
Gave patient avs and calendar with appts. Spoke with Cherish at [facility] and scheduled her appt with them.

## 2016-12-31 NOTE — Assessment & Plan Note (Signed)
Left mastectomy 01/11/2016: IDC with DCIS, 2 tumors, 2 cm and 1.8 cm, 0/2 lymph nodes negative, T1CmN0 stage IA, grade 2, ER 90%, 90%; PR 95%, 95%; HER-2 negative ratio 1.35, 1.33; Ki-67 5%, 3% Patient was diagnosed July 2017 and was treated with neoadjuvant anastrozole for 2 months prior to surgery Oncotype DX score for: ROR 4%  Current treatment: Anastrozole 1 mg daily started 02/13/2016  Anastrozole toxicities: Patient will need right breast mammograms for surveillance. Return to clinic in 1 year for follow-up

## 2017-01-08 ENCOUNTER — Ambulatory Visit: Payer: No Typology Code available for payment source

## 2017-01-14 ENCOUNTER — Ambulatory Visit
Admission: RE | Admit: 2017-01-14 | Discharge: 2017-01-14 | Disposition: A | Discharge status: Home / Self Care | Payer: Medicaid Other | Source: Ambulatory Visit | Attending: Hematology and Oncology | Admitting: Hematology and Oncology

## 2017-01-14 DIAGNOSIS — Z1231 Encounter for screening mammogram for malignant neoplasm of breast: Secondary | ICD-10-CM

## 2017-03-31 MED FILL — ANASTROZOLE 1 MG TABLET: 1 | 90 days supply | Qty: 90 | Fill #1

## 2017-05-20 ENCOUNTER — Inpatient Hospital Stay: Payer: Medicaid Other | Attending: Hematology and Oncology | Admitting: Hematology and Oncology

## 2017-05-20 ENCOUNTER — Telehealth: Payer: Self-pay

## 2017-05-20 DIAGNOSIS — C50512 Malignant neoplasm of lower-outer quadrant of left female breast: Secondary | ICD-10-CM | POA: Diagnosis not present

## 2017-05-20 DIAGNOSIS — Z79811 Long term (current) use of aromatase inhibitors: Secondary | ICD-10-CM | POA: Diagnosis not present

## 2017-05-20 DIAGNOSIS — Z9012 Acquired absence of left breast and nipple: Secondary | ICD-10-CM

## 2017-05-20 DIAGNOSIS — Z170 Estrogen receptor positive status [ER+]: Secondary | ICD-10-CM | POA: Insufficient documentation

## 2017-05-20 DIAGNOSIS — R5383 Other fatigue: Secondary | ICD-10-CM | POA: Diagnosis not present

## 2017-05-20 DIAGNOSIS — Z7984 Long term (current) use of oral hypoglycemic drugs: Secondary | ICD-10-CM | POA: Insufficient documentation

## 2017-05-20 DIAGNOSIS — Z79899 Other long term (current) drug therapy: Secondary | ICD-10-CM | POA: Insufficient documentation

## 2017-05-20 NOTE — Telephone Encounter (Signed)
Pt son called requesting to see if Dr.Gudena would be available to fill out a form for pt to obtain a translator for her [place] citizenship application. Pt son stated that she had failed her english test d/t comprehension issues from antihormonal therapy. They were advised that they will need to schedule appt with Dr.Gudena and have him fill out a [phone] paper to help bring in a translator for her next scheduled exam in February. Discussed with MD and okay to bring in pt for further evaluation.   Scheduled pt to come in with son this afternoon, since he will be travelling soon. Confirmed time/date.

## 2017-05-20 NOTE — Assessment & Plan Note (Signed)
Left mastectomy 01/11/2016: IDC with DCIS, 2 tumors, 2 cm and 1.8 cm, 0/2 lymph nodes negative, T1CmN0 stage IA, grade 2, ER 90%, 90%; PR 95%, 95%; HER-2 negative ratio 1.35, 1.33; Ki-67 5%, 3% Patient was diagnosed July 2017 and was treated with neoadjuvant anastrozole for 2 months prior to surgery Oncotype DX score for: ROR 4%  Current treatment: Anastrozole 1 mg daily started 02/13/2016  Anastrozole toxicities:  Denies any hot flashes or arthralgias or myalgias. Fatigue related to antiestrogen therapy Difficulty in concentrating and learning new information  ----------------------------------------------------------------------- Urgent visit today for evaluation regarding citizenship interview.  Patient has had difficulty with the English part of the interview process.  Patient has had very little formal education and has not been speaking [place] all her life.  She does understand [place] vocabulary but because of antiestrogen therapy has had difficulty in learning the information necessary to pass the citizenship interview test.  I believe that antiestrogen therapy has partly to blame for decrease in learning ability. I filled the paperwork for this and provided to the patient for her interview.  Return to clinic in 1 year for follow-up

## 2017-05-20 NOTE — Progress Notes (Signed)
Patient Care Team: System, Pcp Not In as PCP - General Nicholas Lose, MD as Consulting Physician (Hematology and Oncology) Delice Bison, Charlestine Massed, NP as Nurse Practitioner (Hematology and Oncology) Erroll Luna, MD as Consulting Physician (General Surgery)  DIAGNOSIS:  Encounter Diagnosis  Name Primary?  . Malignant neoplasm of lower-outer quadrant of left breast of female, estrogen receptor positive ([place])     SUMMARY OF ONCOLOGIC HISTORY:   Breast cancer of lower-outer quadrant of left female breast ([place])   11/08/2015 Mammogram    Left breast 5:30 position: 1.7 x 1.6 x 2 cm spiculated mass; 6:00: 1.2 x 1.3 x 1.4 cm separated by 3 cm, left axillary lymph nodes 9 mm, clinical stage TIcN1 stage II A      11/16/2015 Initial Diagnosis    Left breast biopsy: 6:00: Grade 2-3 IDC with DCIS with LVI, ER 90%, PR 95%, Ki-67 3%, HER-2 negative ratio 1.33, left breast biopsy 5:30 position: Grade 2-3 IDC, DCIS, LVI, ER 90%, PR 95%, Ki-67 5%, HER-2 negative ratio 1.35      01/11/2016 Surgery    Left mastectomy: IDC with DCIS, 2 tumors, 2 cm and 1.8 cm, 0/2 lymph nodes negative, T1CmN0 stage IA, grade 2, ER 90%, 90%; PR 95%, 95%; HER-2 negative ratio 1.35, 1.33; Ki-67 5%, 3%      01/2016 -  Anti-estrogen oral therapy    Anastrozole 37m daily      01/31/2016 Oncotype testing    Oncotype: 4/4%       CHIEF COMPLIANT: Urgent visit to discuss her learning difficulties for citizenship interview, a follow-up on anastrozole therapy  INTERVAL HISTORY: Theresa Mattsonis a 67year old with above-mentioned left breast cancer who is here for an urgent visit to discuss her learning difficulties related to citizenship interview.  She was able to understand the question but had difficulty in reading as well as comprehending some of the questions during the interview.  She has had some difficulties with anastrozole including fatigue as well as mental clouding.  REVIEW OF SYSTEMS:   Constitutional:  Denies fevers, chills or abnormal weight loss Eyes: Denies blurriness of vision Ears, nose, mouth, throat, and face: Denies mucositis or sore throat Respiratory: Denies cough, dyspnea or wheezes Cardiovascular: Denies palpitation, chest discomfort Gastrointestinal:  Denies nausea, heartburn or change in bowel habits Skin: Denies abnormal skin rashes Lymphatics: Denies new lymphadenopathy or easy bruising Neurological:Denies numbness, tingling or new weaknesses Behavioral/Psych: Fatigue, mental clouding causing difficulty in learning new information Extremities: No lower extremity edema Breast:  denies any pain or lumps or nodules in either breasts All other systems were reviewed with the patient and are negative.  I have reviewed the past medical history, past surgical history, social history and family history with the patient and they are unchanged from previous note.  ALLERGIES:  is allergic to metformin.  MEDICATIONS:  Current Outpatient Medications  Medication Sig Dispense Refill  . anastrozole (ARIMIDEX) 1 MG tablet TAKE 1 TABLET BY MOUTH DAILY. 90 tablet 3  . glimepiride (AMARYL) 4 MG tablet Take 4 mg by mouth daily. Before breakfast    . losartan (COZAAR) 25 MG tablet Take 50 mg by mouth daily.    . meloxicam (MOBIC) 15 MG tablet Take 15 mg by mouth daily.    . methocarbamol (ROBAXIN) 500 MG tablet Take 1 tablet (500 mg total) by mouth every 6 (six) hours as needed for muscle spasms. 30 tablet 0  . metoprolol succinate (TOPROL-XL) 25 MG 24 hr tablet Take 25 mg by mouth  daily.     No current facility-administered medications for this visit.     PHYSICAL EXAMINATION: ECOG PERFORMANCE STATUS: 1 - Symptomatic but completely ambulatory  Vitals:   05/20/17 1454  BP: (!) 160/72  Pulse: 83  Resp: 18  Temp: 97.8 F (36.6 C)  SpO2: 100%   Filed Weights   05/20/17 1454  Weight: 178 lb 14.4 oz (81.1 kg)    GENERAL:alert, no distress and comfortable SKIN: skin color,  texture, turgor are normal, no rashes or significant lesions EYES: normal, Conjunctiva are pink and non-injected, sclera clear OROPHARYNX:no exudate, no erythema and lips, buccal mucosa, and tongue normal  NECK: supple, thyroid normal size, non-tender, without nodularity LYMPH:  no palpable lymphadenopathy in the cervical, axillary or inguinal LUNGS: clear to auscultation and percussion with normal breathing effort HEART: regular rate & rhythm and no murmurs and no lower extremity edema ABDOMEN:abdomen soft, non-tender and normal bowel sounds MUSCULOSKELETAL:no cyanosis of digits and no clubbing  NEURO: alert & oriented x 3 with fluent speech, on examination I determined that she has difficulty in learning new information based upon my clinical interview and providing her with a basic memory tests.  She has very little formal education and has not been speaking English in a very long time.  She will have profound difficulty in learning language especially being on antiestrogen therapy in the fatigue and mental clouding that it has been causing her. EXTREMITIES: No lower extremity edema  LABORATORY DATA:  I have reviewed the data as listed CMP Latest Ref Rng & Units 01/11/2016  Glucose 65 - 99 mg/dL 102(H)  BUN 6 - 20 mg/dL 26(H)  Creatinine 0.44 - 1.00 mg/dL 0.87  Sodium 135 - 145 mmol/L 140  Potassium 3.5 - 5.1 mmol/L 4.5  Chloride 101 - 111 mmol/L 107  CO2 22 - 32 mmol/L 25  Calcium 8.9 - 10.3 mg/dL 9.3  Total Protein 6.5 - 8.1 g/dL 7.3  Total Bilirubin 0.3 - 1.2 mg/dL 0.5  Alkaline Phos 38 - 126 U/L 80  AST 15 - 41 U/L 39  ALT 14 - 54 U/L 35    Lab Results  Component Value Date   WBC 7.5 01/11/2016   HGB 12.0 01/11/2016   HCT 39.0 01/11/2016   MCV 82.3 01/11/2016   PLT 290 01/11/2016   NEUTROABS 5.1 01/11/2016    ASSESSMENT & PLAN:  Breast cancer of lower-outer quadrant of left female breast ([place]) Left mastectomy 01/11/2016: IDC with DCIS, 2 tumors, 2 cm and 1.8 cm, 0/2  lymph nodes negative, T1CmN0 stage IA, grade 2, ER 90%, 90%; PR 95%, 95%; HER-2 negative ratio 1.35, 1.33; Ki-67 5%, 3% Patient was diagnosed July 2017 and was treated with neoadjuvant anastrozole for 2 months prior to surgery Oncotype DX score for: ROR 4%  Current treatment: Anastrozole 1 mg daily started 02/13/2016  Anastrozole toxicities:  Denies any hot flashes or arthralgias or myalgias. Fatigue related to antiestrogen therapy Difficulty in concentrating and learning new information  ----------------------------------------------------------------------- Urgent visit today for evaluation regarding citizenship interview.  Patient has had difficulty with the English part of the interview process.  Patient has had very little formal education and has not been speaking [place] all her life.  She does understand [place] vocabulary but because of antiestrogen therapy has had difficulty in learning the information necessary to pass the citizenship interview test.  I believe that antiestrogen therapy has partly to blame for decrease in learning ability. I filled the paperwork for this and provided to  the patient for her interview.  Return to clinic at the regularly scheduled follow-up in September.    I spent 25 minutes talking to the patient of which more than half was spent in counseling and coordination of care.  No orders of the defined types were placed in this encounter.  The patient has a good understanding of the overall plan. she agrees with it. she will call with any problems that may develop before the next visit here.   [name], MD 05/20/17

## 2017-05-22 ENCOUNTER — Telehealth: Payer: Self-pay | Admitting: Hematology and Oncology

## 2017-05-22 NOTE — Telephone Encounter (Signed)
No 12/2 los.   

## 2017-06-30 MED FILL — ANASTROZOLE 1 MG TABLET: 1 | 90 days supply | Qty: 90 | Fill #2

## 2017-09-24 MED FILL — ANASTROZOLE 1 MG TABLET: 1 | 90 days supply | Qty: 90 | Fill #3

## 2017-12-26 ENCOUNTER — Telehealth: Payer: Self-pay | Admitting: Hematology and Oncology

## 2017-12-26 NOTE — Telephone Encounter (Signed)
[facility] - 9/4 f/u moved to 9/5. Called patient via [employer] [place] [phone] and spoke with son re change and new date/time for 9/5 @ 3:15 pm.

## 2017-12-31 ENCOUNTER — Ambulatory Visit: Payer: No Typology Code available for payment source | Admitting: Hematology and Oncology

## 2018-01-01 ENCOUNTER — Inpatient Hospital Stay: Payer: Medicaid Other | Attending: Hematology and Oncology | Admitting: Hematology and Oncology

## 2018-01-01 ENCOUNTER — Telehealth: Payer: Self-pay | Admitting: Hematology and Oncology

## 2018-01-01 DIAGNOSIS — Z170 Estrogen receptor positive status [ER+]: Secondary | ICD-10-CM

## 2018-01-01 DIAGNOSIS — Z9012 Acquired absence of left breast and nipple: Secondary | ICD-10-CM | POA: Insufficient documentation

## 2018-01-01 DIAGNOSIS — Z79811 Long term (current) use of aromatase inhibitors: Secondary | ICD-10-CM | POA: Insufficient documentation

## 2018-01-01 DIAGNOSIS — C50512 Malignant neoplasm of lower-outer quadrant of left female breast: Secondary | ICD-10-CM | POA: Insufficient documentation

## 2018-01-01 NOTE — Progress Notes (Signed)
Patient Care Team: System, Pcp Not In as PCP - General Theresa Lose, MD as Consulting Physician (Hematology and Oncology) Theresa Zhang, Theresa Massed, NP as Nurse Practitioner (Hematology and Oncology) Theresa Luna, MD as Consulting Physician (General Surgery)  DIAGNOSIS:  Encounter Diagnosis  Name Primary?  . Malignant neoplasm of lower-outer quadrant of left breast of female, estrogen receptor positive ([place])     SUMMARY OF ONCOLOGIC HISTORY:   Breast cancer of lower-outer quadrant of left female breast ([place])   11/08/2015 Mammogram    Left breast 5:30 position: 1.7 x 1.6 x 2 cm spiculated mass; 6:00: 1.2 x 1.3 x 1.4 cm separated by 3 cm, left axillary lymph nodes 9 mm, clinical stage TIcN1 stage II A    11/16/2015 Initial Diagnosis    Left breast biopsy: 6:00: Grade 2-3 IDC with DCIS with LVI, ER 90%, PR 95%, Ki-67 3%, HER-2 negative ratio 1.33, left breast biopsy 5:30 position: Grade 2-3 IDC, DCIS, LVI, ER 90%, PR 95%, Ki-67 5%, HER-2 negative ratio 1.35    01/11/2016 Surgery    Left mastectomy: IDC with DCIS, 2 tumors, 2 cm and 1.8 cm, 0/2 lymph nodes negative, T1CmN0 stage IA, grade 2, ER 90%, 90%; PR 95%, 95%; HER-2 negative ratio 1.35, 1.33; Ki-67 5%, 3%    01/2016 -  Anti-estrogen oral therapy    Anastrozole 88m daily    01/31/2016 Oncotype testing    Oncotype: 4/4%     CHIEF COMPLIANT: Follow-up on anastrozole therapy  INTERVAL HISTORY: Theresa Zhang a 67year old with above-mentioned history of left breast cancer treated with left mastectomy and is currently on anastrozole therapy since October 2017.  She appears to be tolerating it extremely well.  She does not have any hot flashes or myalgias.  Tells me that her primary care physician has been refilling her medications.  He denies any lumps or nodules in the breast.  Mammograms done in September 2018 were normal.  REVIEW OF SYSTEMS:   Constitutional: Denies fevers, chills or abnormal weight loss Eyes: Denies  blurriness of vision Ears, nose, mouth, throat, and face: Denies mucositis or sore throat Respiratory: Denies cough, dyspnea or wheezes Cardiovascular: Denies palpitation, chest discomfort Gastrointestinal:  Denies nausea, heartburn or change in bowel habits Skin: Denies abnormal skin rashes Lymphatics: Denies new lymphadenopathy or easy bruising Neurological:Denies numbness, tingling or new weaknesses Behavioral/Psych: Mood is stable, no new changes  Extremities: No lower extremity edema Breast:  denies any pain or lumps or nodules in either breasts All other systems were reviewed with the patient and are negative.  I have reviewed the past medical history, past surgical history, social history and family history with the patient and they are unchanged from previous note.  ALLERGIES:  is allergic to metformin.  MEDICATIONS:  Current Outpatient Medications  Medication Sig Dispense Refill  . anastrozole (ARIMIDEX) 1 MG tablet TAKE 1 TABLET BY MOUTH DAILY. 90 tablet 3  . glimepiride (AMARYL) 4 MG tablet Take 4 mg by mouth daily. Before breakfast    . losartan (COZAAR) 25 MG tablet Take 50 mg by mouth daily.    . meloxicam (MOBIC) 15 MG tablet Take 15 mg by mouth daily.    . methocarbamol (ROBAXIN) 500 MG tablet Take 1 tablet (500 mg total) by mouth every 6 (six) hours as needed for muscle spasms. 30 tablet 0  . metoprolol succinate (TOPROL-XL) 25 MG 24 hr tablet Take 25 mg by mouth daily.     No current facility-administered medications for this visit.  PHYSICAL EXAMINATION: ECOG PERFORMANCE STATUS: 1 - Symptomatic but completely ambulatory  Vitals:   01/01/18 1516  BP: (!) 145/70  Pulse: 80  Resp: 16  Temp: 97.8 F (36.6 C)  SpO2: 100%   Filed Weights   01/01/18 1516  Weight: 181 lb (82.1 kg)    GENERAL:alert, no distress and comfortable SKIN: skin color, texture, turgor are normal, no rashes or significant lesions EYES: normal, Conjunctiva are pink and  non-injected, sclera clear OROPHARYNX:no exudate, no erythema and lips, buccal mucosa, and tongue normal  NECK: supple, thyroid normal size, non-tender, without nodularity LYMPH:  no palpable lymphadenopathy in the cervical, axillary or inguinal LUNGS: clear to auscultation and percussion with normal breathing effort HEART: regular rate & rhythm and no murmurs and no lower extremity edema ABDOMEN:abdomen soft, non-tender and normal bowel sounds MUSCULOSKELETAL:no cyanosis of digits and no clubbing  NEURO: alert & oriented x 3 with fluent speech, no focal motor/sensory deficits EXTREMITIES: No lower extremity edema BREAST: No palpable masses or nodules in left chest wall or right breast. No palpable axillary supraclavicular or infraclavicular adenopathy no breast tenderness or nipple discharge. (exam performed in the presence of a chaperone)  LABORATORY DATA:  I have reviewed the data as listed CMP Latest Ref Rng & Units 01/11/2016  Glucose 65 - 99 mg/dL 102(H)  BUN 6 - 20 mg/dL 26(H)  Creatinine 0.44 - 1.00 mg/dL 0.87  Sodium 135 - 145 mmol/L 140  Potassium 3.5 - 5.1 mmol/L 4.5  Chloride 101 - 111 mmol/L 107  CO2 22 - 32 mmol/L 25  Calcium 8.9 - 10.3 mg/dL 9.3  Total Protein 6.5 - 8.1 g/dL 7.3  Total Bilirubin 0.3 - 1.2 mg/dL 0.5  Alkaline Phos 38 - 126 U/L 80  AST 15 - 41 U/L 39  ALT 14 - 54 U/L 35    Lab Results  Component Value Date   WBC 7.5 01/11/2016   HGB 12.0 01/11/2016   HCT 39.0 01/11/2016   MCV 82.3 01/11/2016   PLT 290 01/11/2016   NEUTROABS 5.1 01/11/2016    ASSESSMENT & PLAN:  Breast cancer of lower-outer quadrant of left female breast ([place]) Left mastectomy 01/11/2016: IDC with DCIS, 2 tumors, 2 cm and 1.8 cm, 0/2 lymph nodes negative, T1CmN0 stage IA, grade 2, ER 90%, 90%; PR 95%, 95%; HER-2 negative ratio 1.35, 1.33; Ki-67 5%, 3% Patient was diagnosed July 2017 and was treated with neoadjuvant anastrozole for 2 months prior to surgery Oncotype DX score for:  ROR 4%  Current treatment: Anastrozole 1 mg daily started 02/13/2016  Anastrozole toxicities:  Denies any hot flashes or arthralgias or myalgias. Fatigue related to antiestrogen therapy  Breast cancer surveillance: 1.  Breast exam 01/01/2018: Benign 2. mammogram 01/14/2017: Benign breast density category B  Return to clinic in 1 year for follow-up  No orders of the defined types were placed in this encounter.  The patient has a good understanding of the overall plan. she agrees with it. she will call with any problems that may develop before the next visit here.   [name], MD 01/01/18

## 2018-01-01 NOTE — Telephone Encounter (Signed)
Gave pt avs and calendar  °

## 2018-01-01 NOTE — Assessment & Plan Note (Signed)
Left mastectomy 01/11/2016: IDC with DCIS, 2 tumors, 2 cm and 1.8 cm, 0/2 lymph nodes negative, T1CmN0 stage IA, grade 2, ER 90%, 90%; PR 95%, 95%; HER-2 negative ratio 1.35, 1.33; Ki-67 5%, 3% Patient was diagnosed July 2017 and was treated with neoadjuvant anastrozole for 2 months prior to surgery Oncotype DX score for: ROR 4%  Current treatment: Anastrozole 1 mg daily started 02/13/2016  Anastrozole toxicities:  Denies any hot flashes or arthralgias or myalgias. Fatigue related to antiestrogen therapy Difficulty in concentrating and learning new information  Breast cancer surveillance: 1.  Breast exam 01/01/2018: Benign 2. mammogram 01/14/2017: Benign breast density category B  Return to clinic in 1 year for follow-up

## 2018-03-02 ENCOUNTER — Ambulatory Visit: Payer: Medicaid Other

## 2018-03-25 ENCOUNTER — Other Ambulatory Visit: Payer: Self-pay | Admitting: Hematology and Oncology

## 2018-03-25 MED FILL — ANASTROZOLE 1 MG TABLET: 1 | 90 days supply | Qty: 90 | Fill #0

## 2018-04-10 ENCOUNTER — Ambulatory Visit
Admission: RE | Admit: 2018-04-10 | Discharge: 2018-04-10 | Disposition: A | Discharge status: Home / Self Care | Payer: Medicaid Other | Source: Ambulatory Visit | Attending: Hematology and Oncology | Admitting: Hematology and Oncology

## 2018-04-10 DIAGNOSIS — C50512 Malignant neoplasm of lower-outer quadrant of left female breast: Secondary | ICD-10-CM

## 2018-04-10 DIAGNOSIS — Z170 Estrogen receptor positive status [ER+]: Principal | ICD-10-CM

## 2018-12-24 NOTE — Assessment & Plan Note (Deleted)
Left mastectomy 01/11/2016: IDC with DCIS, 2 tumors, 2 cm and 1.8 cm, 0/2 lymph nodes negative, T1CmN0 stage IA, grade 2, ER 90%, 90%; PR 95%, 95%; HER-2 negative ratio 1.35, 1.33; Ki-67 5%, 3% Patient was diagnosed July 2017 and was treated with neoadjuvant anastrozole for 2 months prior to surgery Oncotype DX score for: ROR 4%  Current treatment: Anastrozole 1 mg daily started 02/13/2016  Anastrozole toxicities: Denies any hot flashes or arthralgias or myalgias. Fatigue related to antiestrogen therapy  Breast cancer surveillance: 1.  Breast exam 01/01/2018: Benign 2. mammogram 04/13/2018: Benign breast density category C  Return to clinic in 1 year for follow-up

## 2018-12-25 ENCOUNTER — Telehealth: Payer: Self-pay | Admitting: Hematology and Oncology

## 2018-12-25 NOTE — Telephone Encounter (Signed)
I could not reach patient regarding covert to virtual visit

## 2018-12-31 ENCOUNTER — Inpatient Hospital Stay: Payer: Self-pay | Attending: Hematology and Oncology | Admitting: Hematology and Oncology

## 2019-12-27 ENCOUNTER — Telehealth: Payer: Self-pay | Admitting: Hematology and Oncology

## 2019-12-27 NOTE — Telephone Encounter (Signed)
Scheduled appt per 8/30 schmsg - pt son is aware of appt date and time

## 2020-01-17 NOTE — Progress Notes (Signed)
Patient Care Team: Lin Landsman, MD as PCP - General (Family Medicine) Nicholas Lose, MD as Consulting Physician (Hematology and Oncology) Delice Bison, Charlestine Massed, NP as Nurse Practitioner (Hematology and Oncology) Erroll Luna, MD as Consulting Physician (General Surgery)  DIAGNOSIS:    ICD-10-CM   1. Malignant neoplasm of lower-outer quadrant of left breast of female, estrogen receptor positive ([place])  C50.512 MM DIAG BREAST TOMO UNI RIGHT   Z17.0     SUMMARY OF ONCOLOGIC HISTORY: Oncology History  Breast cancer of lower-outer quadrant of left female breast ([place])  11/08/2015 Mammogram   Left breast 5:30 position: 1.7 x 1.6 x 2 cm spiculated mass; 6:00: 1.2 x 1.3 x 1.4 cm separated by 3 cm, left axillary lymph nodes 9 mm, clinical stage TIcN1 stage II A   11/16/2015 Initial Diagnosis   Left breast biopsy: 6:00: Grade 2-3 IDC with DCIS with LVI, ER 90%, PR 95%, Ki-67 3%, HER-2 negative ratio 1.33, left breast biopsy 5:30 position: Grade 2-3 IDC, DCIS, LVI, ER 90%, PR 95%, Ki-67 5%, HER-2 negative ratio 1.35   01/11/2016 Surgery   Left mastectomy: IDC with DCIS, 2 tumors, 2 cm and 1.8 cm, 0/2 lymph nodes negative, T1CmN0 stage IA, grade 2, ER 90%, 90%; PR 95%, 95%; HER-2 negative ratio 1.35, 1.33; Ki-67 5%, 3%   01/2016 -  Anti-estrogen oral therapy   Anastrozole 15m daily   01/31/2016 Oncotype testing   Oncotype: 4/4%     CHIEF COMPLIANT: Follow-up of left breast cancer on anastrozole therapy  INTERVAL HISTORY: Theresa Muckleis a 69y.o. with above-mentioned history of left breast cancer treated with left mastectomy and who is currently on anastrozole therapy. Mammogram on 04/13/18 showed no evidence of malignancy bilaterally. I last saw her 2 years ago. She presents to the clinic today for follow-up.  [place]  [place]  In the area she had profound  swelling of her left leg.  This was managed by her primary care team and it is starting to improve.  She tells me that she has been tolerating anastrozole extremely well without any problems or concerns.  ALLERGIES:  is allergic to metformin.  MEDICATIONS:  Current Outpatient Medications  Medication Sig Dispense Refill  . anastrozole (ARIMIDEX) 1 MG tablet Take 1 tablet (1 mg total) by mouth daily. 90 tablet 3  . glimepiride (AMARYL) 4 MG tablet Take 4 mg by mouth daily. Before breakfast    . losartan (COZAAR) 25 MG tablet Take 50 mg by mouth daily.    . meloxicam (MOBIC) 15 MG tablet Take 15 mg by mouth daily.    . metoprolol succinate (TOPROL-XL) 25 MG 24 hr tablet Take 25 mg by mouth daily.     No current facility-administered medications for this visit.    PHYSICAL EXAMINATION: ECOG PERFORMANCE STATUS: 2 - Symptomatic, <50% confined to bed  Vitals:   01/18/20 1542  BP: (!) 154/68  Pulse: 66  Resp: 18  Temp: (!) 97.3 F (36.3 C)  SpO2: 100%   Filed Weights   01/18/20 1542  Weight: 164 lb 14.4 oz (74.8 kg)      LABORATORY DATA:  I have reviewed the data as listed CMP Latest Ref Rng & Units 01/11/2016  Glucose 65 - 99 mg/dL 102(H)  BUN 6 - 20 mg/dL 26(H)  Creatinine 0.44 - 1.00 mg/dL 0.87  Sodium  135 - 145 mmol/L 140  Potassium 3.5 - 5.1 mmol/L 4.5  Chloride 101 - 111 mmol/L 107  CO2 22 - 32 mmol/L 25  Calcium 8.9 - 10.3 mg/dL 9.3  Total Protein 6.5 - 8.1 g/dL 7.3  Total Bilirubin 0.3 - 1.2 mg/dL 0.5  Alkaline Phos 38 - 126 U/L 80  AST 15 - 41 U/L 39  ALT 14 - 54 U/L 35    Lab Results  Component Value Date   WBC 7.5 01/11/2016   HGB 12.0 01/11/2016   HCT 39.0 01/11/2016   MCV 82.3 01/11/2016   PLT 290 01/11/2016   NEUTROABS 5.1 01/11/2016    ASSESSMENT & PLAN:  Breast cancer of lower-outer quadrant of left female breast ([place]) Left mastectomy 01/11/2016: IDC with DCIS, 2 tumors, 2 cm and 1.8 cm, 0/2 lymph nodes negative, T1CmN0 stage IA, grade 2, ER 90%,  90%; PR 95%, 95%; HER-2 negative ratio 1.35, 1.33; Ki-67 5%, 3% Patient was diagnosed July 2017 and was treated with neoadjuvant anastrozole for 2 months prior to surgery Oncotype DX score for: ROR 4%  Current treatment: Anastrozole 1 mg daily started 02/13/2016  Anastrozole toxicities: Denies any hot flashes or arthralgias or myalgias.    Breast cancer surveillance: 1.  Breast exam  : Benign 2. mammogram 04/13/2018: Benign breast density category B I ordered a new mammogram to be done on the right breast in the next 2 weeks. I renewed her prescription for anastrozole.  I strongly encouraged her to walk every day and to cut down on sugar intake. Return to clinic in 1 year for follow-up    Orders Placed This Encounter  Procedures  . MM DIAG BREAST TOMO UNI RIGHT    Standing Status:   Future    Standing Expiration Date:   01/17/2021    Order Specific Question:   Reason for Exam (SYMPTOM  OR DIAGNOSIS REQUIRED)    Answer:   Annual mammograms    Order Specific Question:   Preferred imaging location?    Answer:   [facility]    Order Specific Question:   Release to patient    Answer:   Immediate   The patient has a good understanding of the overall plan. she agrees with it. she will call with any problems that may develop before the next visit here.  Total time spent: 20 mins including face to face time and time spent for planning, charting and coordination of care  Nicholas Lose, MD 01/18/2020  I, Cloyde Reams Dorshimer, am acting as scribe for Dr. Nicholas Lose.  I have reviewed the above documentation for accuracy and completeness, and I agree with the above.

## 2020-01-18 ENCOUNTER — Inpatient Hospital Stay: Payer: Medicaid Other | Attending: Hematology and Oncology | Admitting: Hematology and Oncology

## 2020-01-18 ENCOUNTER — Other Ambulatory Visit: Payer: Self-pay | Admitting: Hematology and Oncology

## 2020-01-18 ENCOUNTER — Other Ambulatory Visit: Payer: Self-pay

## 2020-01-18 DIAGNOSIS — Z79811 Long term (current) use of aromatase inhibitors: Secondary | ICD-10-CM | POA: Insufficient documentation

## 2020-01-18 DIAGNOSIS — Z7984 Long term (current) use of oral hypoglycemic drugs: Secondary | ICD-10-CM | POA: Insufficient documentation

## 2020-01-18 DIAGNOSIS — Z170 Estrogen receptor positive status [ER+]: Secondary | ICD-10-CM | POA: Insufficient documentation

## 2020-01-18 DIAGNOSIS — C50512 Malignant neoplasm of lower-outer quadrant of left female breast: Secondary | ICD-10-CM | POA: Insufficient documentation

## 2020-01-18 DIAGNOSIS — Z79899 Other long term (current) drug therapy: Secondary | ICD-10-CM | POA: Insufficient documentation

## 2020-01-18 MED ORDER — ANASTROZOLE 1 MG PO TABS: 1.0000 mg | ORAL_TABLET | Freq: Every day | ORAL | 3 refills | Status: DC

## 2020-01-18 MED FILL — ANASTROZOLE 1 MG TABLET: 1 | 90 days supply | Qty: 90 | Fill #0

## 2020-01-18 NOTE — Assessment & Plan Note (Signed)
Left mastectomy 01/11/2016: IDC with DCIS, 2 tumors, 2 cm and 1.8 cm, 0/2 lymph nodes negative, T1CmN0 stage IA, grade 2, ER 90%, 90%; PR 95%, 95%; HER-2 negative ratio 1.35, 1.33; Ki-67 5%, 3% Patient was diagnosed July 2017 and was treated with neoadjuvant anastrozole for 2 months prior to surgery Oncotype DX score for: ROR 4%  Current treatment: Anastrozole 1 mg daily started 02/13/2016  Anastrozole toxicities: Denies any hot flashes or arthralgias or myalgias. Fatigue related to antiestrogen therapy  Breast cancer surveillance: 1.  Breast exam 01/18/2020: Benign 2. mammogram 04/13/2018: Benign breast density category B  Return to clinic in 1 year for follow-up

## 2020-03-20 ENCOUNTER — Ambulatory Visit: Payer: Medicaid Other

## 2020-04-13 MED FILL — ANASTROZOLE 1 MG TABLET: 1 | 90 days supply | Qty: 90 | Fill #1

## 2020-04-24 ENCOUNTER — Ambulatory Visit
Admission: RE | Admit: 2020-04-24 | Discharge: 2020-04-24 | Disposition: A | Discharge status: Home / Self Care | Payer: Medicaid Other | Source: Ambulatory Visit | Attending: Hematology and Oncology | Admitting: Hematology and Oncology

## 2020-04-24 ENCOUNTER — Other Ambulatory Visit: Payer: Self-pay

## 2020-04-24 DIAGNOSIS — C50512 Malignant neoplasm of lower-outer quadrant of left female breast: Secondary | ICD-10-CM

## 2020-10-09 ENCOUNTER — Other Ambulatory Visit (HOSPITAL_COMMUNITY): Payer: Self-pay

## 2020-10-09 MED FILL — Anastrozole Tab 1 MG: ORAL | 90 days supply | Qty: 90 | Fill #0 | Status: AC

## 2020-11-17 ENCOUNTER — Other Ambulatory Visit: Payer: Self-pay | Admitting: Hematology and Oncology

## 2020-11-17 ENCOUNTER — Other Ambulatory Visit (HOSPITAL_COMMUNITY): Payer: Self-pay

## 2020-11-17 MED ORDER — ANASTROZOLE 1 MG PO TABS
ORAL_TABLET | Freq: Every day | ORAL | 3 refills | Status: DC
  Filled 2020-11-17: qty 90, 90d supply, fill #0
  Filled 2021-04-11: qty 90, 90d supply, fill #1

## 2021-01-16 NOTE — Assessment & Plan Note (Deleted)
Left mastectomy 01/11/2016: IDC with DCIS, 2 tumors, 2 cm and 1.8 cm, 0/2 lymph nodes negative, T1CmN0 stage IA, grade 2, ER 90%, 90%; PR 95%, 95%; HER-2 negative ratio 1.35, 1.33; Ki-67 5%, 3% Patient was diagnosed July 2017 and was treated with neoadjuvant anastrozole for 2 months prior to surgery Oncotype DX score for: ROR 4%  Current treatment: Anastrozole 1 mg daily started 02/13/2016  Anastrozole toxicities: Denies any hot flashes or arthralgias or myalgias.    Breast cancer surveillance: 1.Breast exam 01/17/2021 : Benign 2.mammogram  04/25/2020 right breast: Benign breast density category B   I renewed her prescription for anastrozole.  I strongly encouraged her to walk every day and to cut down on sugar intake. Return to clinic in 1 year for follow-up

## 2021-01-17 ENCOUNTER — Ambulatory Visit: Payer: Medicaid Other | Admitting: Hematology and Oncology

## 2021-01-17 ENCOUNTER — Telehealth: Payer: Self-pay

## 2021-01-17 DIAGNOSIS — Z170 Estrogen receptor positive status [ER+]: Secondary | ICD-10-CM

## 2021-01-17 NOTE — Telephone Encounter (Signed)
Attempted to call pt's son, Ashwani regarding pt's appt. Pt is currently /NS. LVM for son to return call.

## 2021-04-11 ENCOUNTER — Other Ambulatory Visit (HOSPITAL_COMMUNITY): Payer: Self-pay

## 2021-05-08 NOTE — Assessment & Plan Note (Signed)
Left mastectomy 01/11/2016: IDC with DCIS, 2 tumors, 2 cm and 1.8 cm, 0/2 lymph nodes negative, T1CmN0 stage IA, grade 2, ER 90%, 90%; PR 95%, 95%; HER-2 negative ratio 1.35, 1.33; Ki-67 5%, 3% Patient was diagnosed July 2017 and was treated with neoadjuvant anastrozole for 2 months prior to surgery Oncotype DX score for: ROR 4%  Current treatment: Anastrozole 1 mg daily started 02/13/2016  Anastrozole toxicities: Denies any hot flashes or arthralgias or myalgias.    Breast cancer surveillance: 1.Breast exam  : Benign 2.mammogram 04/25/2020 Benign breast density category B  I renewed her prescription for anastrozole.  I strongly encouraged her to walk every day and to cut down on sugar intake. Return to clinic in 1 year for follow-up

## 2021-05-09 ENCOUNTER — Inpatient Hospital Stay: Payer: Medicaid Other | Attending: Hematology and Oncology | Admitting: Hematology and Oncology

## 2021-05-09 ENCOUNTER — Other Ambulatory Visit (HOSPITAL_COMMUNITY): Payer: Self-pay

## 2021-05-09 ENCOUNTER — Other Ambulatory Visit: Payer: Self-pay

## 2021-05-09 DIAGNOSIS — Z79811 Long term (current) use of aromatase inhibitors: Secondary | ICD-10-CM | POA: Insufficient documentation

## 2021-05-09 DIAGNOSIS — Z79899 Other long term (current) drug therapy: Secondary | ICD-10-CM | POA: Insufficient documentation

## 2021-05-09 DIAGNOSIS — Z170 Estrogen receptor positive status [ER+]: Secondary | ICD-10-CM | POA: Diagnosis not present

## 2021-05-09 DIAGNOSIS — Z7984 Long term (current) use of oral hypoglycemic drugs: Secondary | ICD-10-CM | POA: Insufficient documentation

## 2021-05-09 DIAGNOSIS — Z791 Long term (current) use of non-steroidal anti-inflammatories (NSAID): Secondary | ICD-10-CM | POA: Insufficient documentation

## 2021-05-09 DIAGNOSIS — C50512 Malignant neoplasm of lower-outer quadrant of left female breast: Secondary | ICD-10-CM | POA: Insufficient documentation

## 2021-05-09 MED ORDER — ANASTROZOLE 1 MG PO TABS
ORAL_TABLET | Freq: Every day | ORAL | 3 refills | Status: DC
  Filled 2021-05-09: qty 90, fill #0
  Filled 2021-07-09: qty 90, 90d supply, fill #0
  Filled 2021-10-02: qty 90, 90d supply, fill #1
  Filled 2022-01-02: qty 90, 90d supply, fill #2
  Filled 2022-04-05: qty 90, 90d supply, fill #3

## 2021-05-09 NOTE — Progress Notes (Signed)
Patient Care Team: Lin Landsman, MD as PCP - General (Family Medicine) Nicholas Lose, MD as Consulting Physician (Hematology and Oncology) Delice Bison Charlestine Massed, NP as Nurse Practitioner (Hematology and Oncology) Erroll Luna, MD as Consulting Physician (General Surgery)  DIAGNOSIS:  Encounter Diagnosis  Name Primary?   Malignant neoplasm of lower-outer quadrant of left breast of female, estrogen receptor positive ([place])     SUMMARY OF ONCOLOGIC HISTORY: Oncology History  Breast cancer of lower-outer quadrant of left female breast ([place])  11/08/2015 Mammogram   Left breast 5:30 position: 1.7 x 1.6 x 2 cm spiculated mass; 6:00: 1.2 x 1.3 x 1.4 cm separated by 3 cm, left axillary lymph nodes 9 mm, clinical stage TIcN1 stage II A   11/16/2015 Initial Diagnosis   Left breast biopsy: 6:00: Grade 2-3 IDC with DCIS with LVI, ER 90%, PR 95%, Ki-67 3%, HER-2 negative ratio 1.33, left breast biopsy 5:30 position: Grade 2-3 IDC, DCIS, LVI, ER 90%, PR 95%, Ki-67 5%, HER-2 negative ratio 1.35   01/11/2016 Surgery   Left mastectomy: IDC with DCIS, 2 tumors, 2 cm and 1.8 cm, 0/2 lymph nodes negative, T1CmN0 stage IA, grade 2, ER 90%, 90%; PR 95%, 95%; HER-2 negative ratio 1.35, 1.33; Ki-67 5%, 3%   01/2016 -  Anti-estrogen oral therapy   Anastrozole 39m daily   01/31/2016 Oncotype testing   Oncotype: 4/4%     CHIEF COMPLIANT: Follow-up of anastrozole therapy  INTERVAL HISTORY: Theresa Zhang a 71year old with above-mentioned history of left breast cancer who is currently on anastrozole therapy and appears to be tolerating it fairly well.  She does have a little bit of stiffness in the hands but otherwise denies any hot flashes.  Denies any lumps or nodules in the breast or chest wall.   ALLERGIES:  is allergic to metformin.  MEDICATIONS:  Current Outpatient Medications  Medication Sig Dispense Refill   anastrozole (ARIMIDEX) 1 MG tablet TAKE 1 TABLET BY MOUTH ONCE DAILY 90  tablet 3   glimepiride (AMARYL) 4 MG tablet Take 4 mg by mouth daily. Before breakfast     losartan (COZAAR) 25 MG tablet Take 50 mg by mouth daily.     meloxicam (MOBIC) 15 MG tablet Take 15 mg by mouth daily.     metoprolol succinate (TOPROL-XL) 25 MG 24 hr tablet Take 25 mg by mouth daily.     No current facility-administered medications for this visit.    PHYSICAL EXAMINATION: ECOG PERFORMANCE STATUS: 1 - Symptomatic but completely ambulatory  Vitals:   05/09/21 0917  BP: (!) 163/55  Pulse: 66  Resp: 18  Temp: (!) 97.5 F (36.4 C)  SpO2: 95%   Filed Weights   05/09/21 0917  Weight: 170 lb 9.6 oz (77.4 kg)    BREAST: No palpable masses or nodules in either right or left breasts. No palpable axillary supraclavicular or infraclavicular adenopathy no breast tenderness or nipple discharge. (exam performed in the presence of a chaperone)  LABORATORY DATA:  I have reviewed the data as listed CMP Latest Ref Rng & Units 01/11/2016  Glucose 65 - 99 mg/dL 102(H)  BUN 6 - 20 mg/dL 26(H)  Creatinine 0.44 - 1.00 mg/dL 0.87  Sodium 135 - 145 mmol/L 140  Potassium 3.5 - 5.1 mmol/L 4.5  Chloride 101 - 111 mmol/L 107  CO2 22 - 32 mmol/L 25  Calcium 8.9 - 10.3 mg/dL 9.3  Total Protein 6.5 - 8.1 g/dL 7.3  Total Bilirubin 0.3 - 1.2 mg/dL 0.5  Alkaline  Phos 38 - 126 U/L 80  AST 15 - 41 U/L 39  ALT 14 - 54 U/L 35    Lab Results  Component Value Date   WBC 7.5 01/11/2016   HGB 12.0 01/11/2016   HCT 39.0 01/11/2016   MCV 82.3 01/11/2016   PLT 290 01/11/2016   NEUTROABS 5.1 01/11/2016    ASSESSMENT & PLAN:  Breast cancer of lower-outer quadrant of left female breast ([place]) Left mastectomy 01/11/2016: IDC with DCIS, 2 tumors, 2 cm and 1.8 cm, 0/2 lymph nodes negative, T1CmN0 stage IA, grade 2, ER 90%, 90%; PR 95%, 95%; HER-2 negative ratio 1.35, 1.33; Ki-67 5%, 3% Patient was diagnosed July 2017 and was treated with neoadjuvant anastrozole for 2 months prior to surgery Oncotype DX  score for: ROR 4%   Current treatment: Anastrozole 1 mg daily started 02/13/2016   Anastrozole toxicities:  Denies any hot flashes or arthralgias or myalgias. Plan to treat her for total of 7 years.   Breast cancer surveillance: 1.  Breast exam 05/09/2021 Benign 2. mammogram 04/25/2020 Benign breast density category B She has not had a mammogram and I requested her family to make an appointment and get a mammogram done anytime now. I renewed her prescription for anastrozole.   I strongly encouraged her to walk every day and to cut down on sugar intake. Return to clinic in 1 year for follow-up   No orders of the defined types were placed in this encounter.  The patient has a good understanding of the overall plan. she agrees with it. she will call with any problems that may develop before the next visit here. Total time spent: 30 mins including face to face time and time spent for planning, charting and co-ordination of care   [name], MD 05/09/21

## 2021-05-29 ENCOUNTER — Other Ambulatory Visit: Payer: Self-pay | Admitting: Hematology and Oncology

## 2021-05-29 DIAGNOSIS — Z1231 Encounter for screening mammogram for malignant neoplasm of breast: Secondary | ICD-10-CM

## 2021-06-07 ENCOUNTER — Ambulatory Visit
Admission: RE | Admit: 2021-06-07 | Discharge: 2021-06-07 | Disposition: A | Discharge status: Home / Self Care | Payer: Medicaid Other | Source: Ambulatory Visit | Attending: Hematology and Oncology | Admitting: Hematology and Oncology

## 2021-06-07 DIAGNOSIS — Z1231 Encounter for screening mammogram for malignant neoplasm of breast: Secondary | ICD-10-CM

## 2021-07-09 ENCOUNTER — Other Ambulatory Visit (HOSPITAL_COMMUNITY): Payer: Self-pay

## 2021-07-11 ENCOUNTER — Other Ambulatory Visit (HOSPITAL_COMMUNITY): Payer: Self-pay

## 2021-10-02 ENCOUNTER — Other Ambulatory Visit (HOSPITAL_COMMUNITY): Payer: Self-pay

## 2021-10-04 ENCOUNTER — Other Ambulatory Visit (HOSPITAL_COMMUNITY): Payer: Self-pay

## 2022-01-02 ENCOUNTER — Other Ambulatory Visit (HOSPITAL_COMMUNITY): Payer: Self-pay

## 2022-04-05 ENCOUNTER — Other Ambulatory Visit (HOSPITAL_COMMUNITY): Payer: Self-pay

## 2022-04-06 ENCOUNTER — Other Ambulatory Visit (HOSPITAL_COMMUNITY): Payer: Self-pay

## 2022-05-01 NOTE — Progress Notes (Signed)
Patient Care Team: Lin Landsman, MD as PCP - General (Family Medicine) Nicholas Lose, MD as Consulting Physician (Hematology and Oncology) Delice Bison Charlestine Massed, NP as Nurse Practitioner (Hematology and Oncology) Erroll Luna, MD as Consulting Physician (General Surgery)  DIAGNOSIS:  Encounter Diagnosis  Name Primary?   Malignant neoplasm of lower-outer quadrant of left breast of female, estrogen receptor positive ([place]) Yes    SUMMARY OF ONCOLOGIC HISTORY: Oncology History  Breast cancer of lower-outer quadrant of left female breast ([place])  11/08/2015 Mammogram   Left breast 5:30 position: 1.7 x 1.6 x 2 cm spiculated mass; 6:00: 1.2 x 1.3 x 1.4 cm separated by 3 cm, left axillary lymph nodes 9 mm, clinical stage TIcN1 stage II A   11/16/2015 Initial Diagnosis   Left breast biopsy: 6:00: Grade 2-3 IDC with DCIS with LVI, ER 90%, PR 95%, Ki-67 3%, HER-2 negative ratio 1.33, left breast biopsy 5:30 position: Grade 2-3 IDC, DCIS, LVI, ER 90%, PR 95%, Ki-67 5%, HER-2 negative ratio 1.35   01/11/2016 Surgery   Left mastectomy: IDC with DCIS, 2 tumors, 2 cm and 1.8 cm, 0/2 lymph nodes negative, T1CmN0 stage IA, grade 2, ER 90%, 90%; PR 95%, 95%; HER-2 negative ratio 1.35, 1.33; Ki-67 5%, 3%   01/2016 -  Anti-estrogen oral therapy   Anastrozole 68m daily   01/31/2016 Oncotype testing   Oncotype: 4/4%     CHIEF COMPLIANT: Follow-up of anastrozole therapy   INTERVAL HISTORY: Theresa Zhang a 72year old with above-mentioned history of left breast cancer who is currently on anastrozole therapy. She presents to the clinic for a follow-up.  She completed 7 years of therapy and is here today to discuss stopping antiestrogen therapy.  She denies any lumps or nodules in the breast.  She is due for mammogram in February.    ALLERGIES:  is allergic to metformin.  MEDICATIONS:  Current Outpatient Medications  Medication Sig Dispense Refill   anastrozole (ARIMIDEX) 1 MG tablet TAKE  1 TABLET BY MOUTH ONCE DAILY 90 tablet 3   glimepiride (AMARYL) 4 MG tablet Take 4 mg by mouth daily. Before breakfast     losartan (COZAAR) 25 MG tablet Take 50 mg by mouth daily.     meloxicam (MOBIC) 15 MG tablet Take 15 mg by mouth daily.     metoprolol succinate (TOPROL-XL) 25 MG 24 hr tablet Take 25 mg by mouth daily.     No current facility-administered medications for this visit.    PHYSICAL EXAMINATION: ECOG PERFORMANCE STATUS: 1 - Symptomatic but completely ambulatory  Vitals:   05/09/22 0907 05/09/22 0916  BP: (!) 169/64 (!) 144/72  Pulse: 70   Temp: 97.8 F (36.6 C)   SpO2: 99%    Filed Weights   05/09/22 0907  Weight: 163 lb 6.4 oz (74.1 kg)    BREAST: No palpable masses or nodules in either right  breasts and left chest wall. No palpable axillary supraclavicular or infraclavicular adenopathy no breast tenderness or nipple discharge. (exam performed in the presence of a chaperone)  LABORATORY DATA:  I have reviewed the data as listed    Latest Ref Rng & Units 01/11/2016    9:26 AM  CMP  Glucose 65 - 99 mg/dL 102   BUN 6 - 20 mg/dL 26   Creatinine 0.44 - 1.00 mg/dL 0.87   Sodium 135 - 145 mmol/L 140   Potassium 3.5 - 5.1 mmol/L 4.5   Chloride 101 - 111 mmol/L 107   CO2 22 - 32  mmol/L 25   Calcium 8.9 - 10.3 mg/dL 9.3   Total Protein 6.5 - 8.1 g/dL 7.3   Total Bilirubin 0.3 - 1.2 mg/dL 0.5   Alkaline Phos 38 - 126 U/L 80   AST 15 - 41 U/L 39   ALT 14 - 54 U/L 35     Lab Results  Component Value Date   WBC 7.5 01/11/2016   HGB 12.0 01/11/2016   HCT 39.0 01/11/2016   MCV 82.3 01/11/2016   PLT 290 01/11/2016   NEUTROABS 5.1 01/11/2016    ASSESSMENT & PLAN:  Breast cancer of lower-outer quadrant of left female breast ([place]) Left mastectomy 01/11/2016: IDC with DCIS, 2 tumors, 2 cm and 1.8 cm, 0/2 lymph nodes negative, T1CmN0 stage IA, grade 2, ER 90%, 90%; PR 95%, 95%; HER-2 negative ratio 1.35, 1.33; Ki-67 5%, 3% Patient was diagnosed July 2017 and  was treated with neoadjuvant anastrozole for 2 months prior to surgery Oncotype DX score for: ROR 4%   Current treatment: Anastrozole 1 mg daily started 02/13/2016-05/09/2022   Anastrozole toxicities:  Denies any hot flashes or arthralgias or myalgias. Since she completed 7 years of therapy she will discontinue it at this time.   Breast cancer surveillance: 1.  Breast exam 05/09/2022 Benign 2. mammogram 06/07/2021 benign breast density category B I renewed her prescription for anastrozole.   I strongly encouraged her to walk every day and to cut down on sugar intake. Return to clinic on an as-needed basis.    No orders of the defined types were placed in this encounter.  The patient has a good understanding of the overall plan. she agrees with it. she will call with any problems that may develop before the next visit here. Total time spent: 30 mins including face to face time and time spent for planning, charting and co-ordination of care   [name], MD 05/09/22    I Gardiner Coins am acting as a Education administrator for [employer]  I have reviewed the above documentation for accuracy and completeness, and I agree with the above.

## 2022-05-09 ENCOUNTER — Inpatient Hospital Stay: Payer: Medicaid Other | Attending: Hematology and Oncology | Admitting: Hematology and Oncology

## 2022-05-09 VITALS — BP 144/72 | HR 70 | Temp 97.8°F | Wt 163.4 lb

## 2022-05-09 DIAGNOSIS — Z79811 Long term (current) use of aromatase inhibitors: Secondary | ICD-10-CM | POA: Diagnosis not present

## 2022-05-09 DIAGNOSIS — Z170 Estrogen receptor positive status [ER+]: Secondary | ICD-10-CM

## 2022-05-09 DIAGNOSIS — Z79899 Other long term (current) drug therapy: Secondary | ICD-10-CM | POA: Diagnosis not present

## 2022-05-09 DIAGNOSIS — C50512 Malignant neoplasm of lower-outer quadrant of left female breast: Secondary | ICD-10-CM | POA: Diagnosis not present

## 2022-05-09 NOTE — Assessment & Plan Note (Signed)
Left mastectomy 01/11/2016: IDC with DCIS, 2 tumors, 2 cm and 1.8 cm, 0/2 lymph nodes negative, T1CmN0 stage IA, grade 2, ER 90%, 90%; PR 95%, 95%; Theresa Zhang-2 negative ratio 1.35, 1.33; Ki-67 5%, 3% Patient was diagnosed July 2017 and was treated with neoadjuvant anastrozole for 2 months prior to surgery Oncotype DX score for: ROR 4%   Current treatment: Anastrozole 1 mg daily started 02/13/2016   Anastrozole toxicities:  Denies any hot flashes or arthralgias or myalgias. Plan to treat Theresa Zhang for total of 7 years.   Breast cancer surveillance: 1.  Breast exam 05/09/2022 Benign 2. mammogram 06/07/2021 benign breast density category B I renewed Theresa Zhang prescription for anastrozole.   I strongly encouraged Theresa Zhang to walk every day and to cut down on sugar intake. Return to clinic in 1 year for follow-up

## 2023-03-06 ENCOUNTER — Other Ambulatory Visit: Payer: Self-pay | Admitting: Family Medicine

## 2023-03-06 DIAGNOSIS — Z1231 Encounter for screening mammogram for malignant neoplasm of breast: Secondary | ICD-10-CM

## 2023-04-01 ENCOUNTER — Ambulatory Visit
Admission: RE | Admit: 2023-04-01 | Discharge: 2023-04-01 | Disposition: A | Discharge status: Home / Self Care | Payer: Medicaid Other | Source: Ambulatory Visit | Attending: Family Medicine | Admitting: Family Medicine

## 2023-04-01 DIAGNOSIS — Z1231 Encounter for screening mammogram for malignant neoplasm of breast: Secondary | ICD-10-CM

## 2023-12-25 IMAGING — MG MM DIGITAL SCREENING UNILAT*R* W/ TOMO W/ CAD
4 series · 4 of 12 positions shown · non-contrast
Comparison: Previous exam(s).

CLINICAL DATA: Screening.

EXAM:
DIGITAL SCREENING UNILATERAL RIGHT MAMMOGRAM WITH CAD AND
TOMOSYNTHESIS
TECHNIQUE: Right screening digital craniocaudal and mediolateral oblique
mammograms were obtained. Right screening digital breast
tomosynthesis was performed. The images were evaluated with
computer-aided detection.

[R MLO synth-2D]
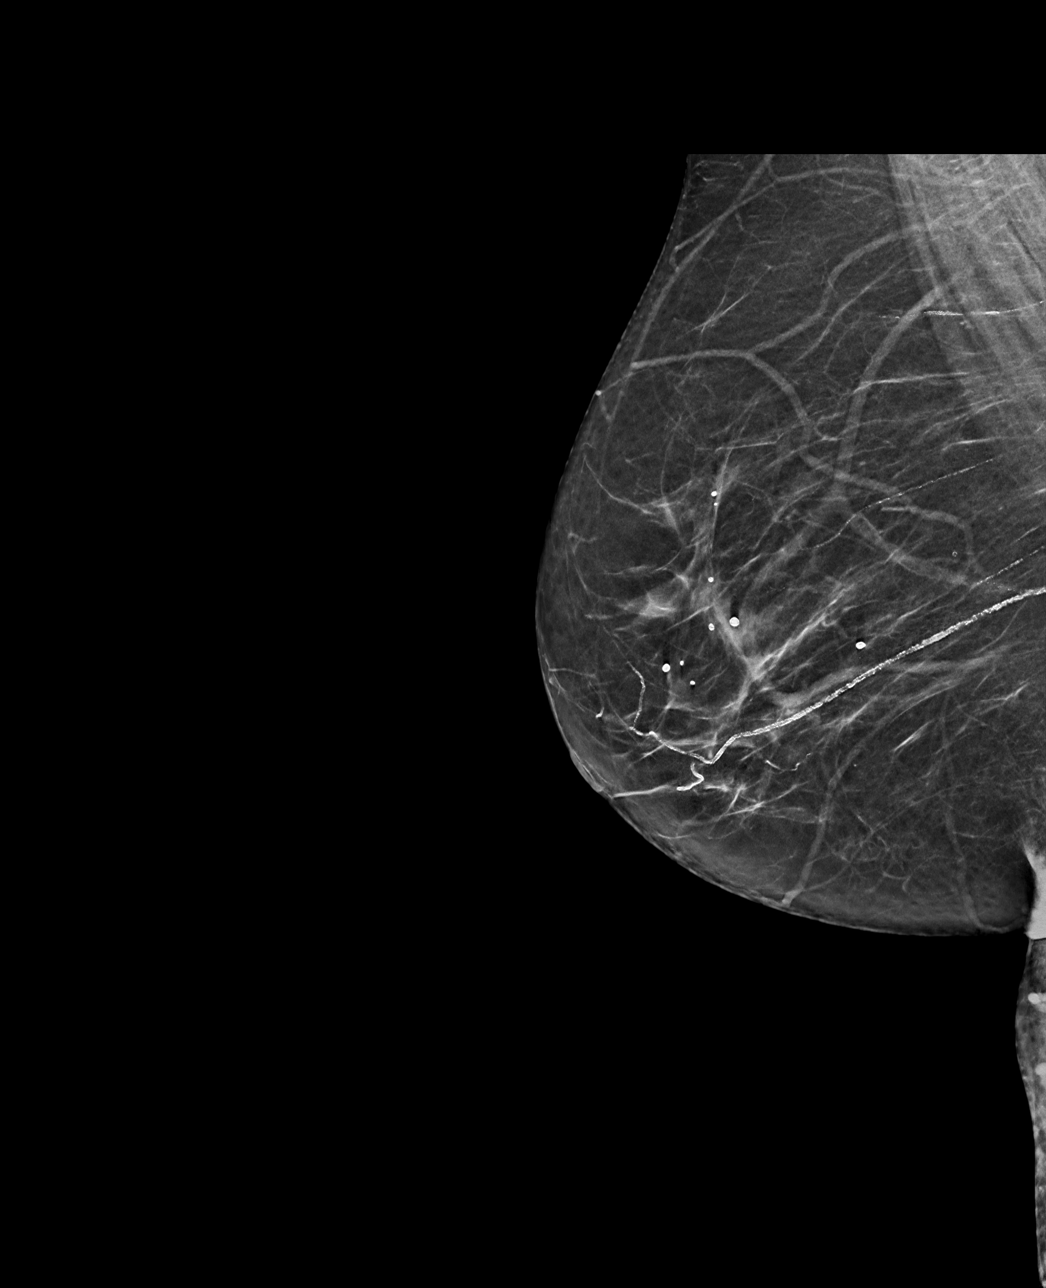

[R CC synth-2D]
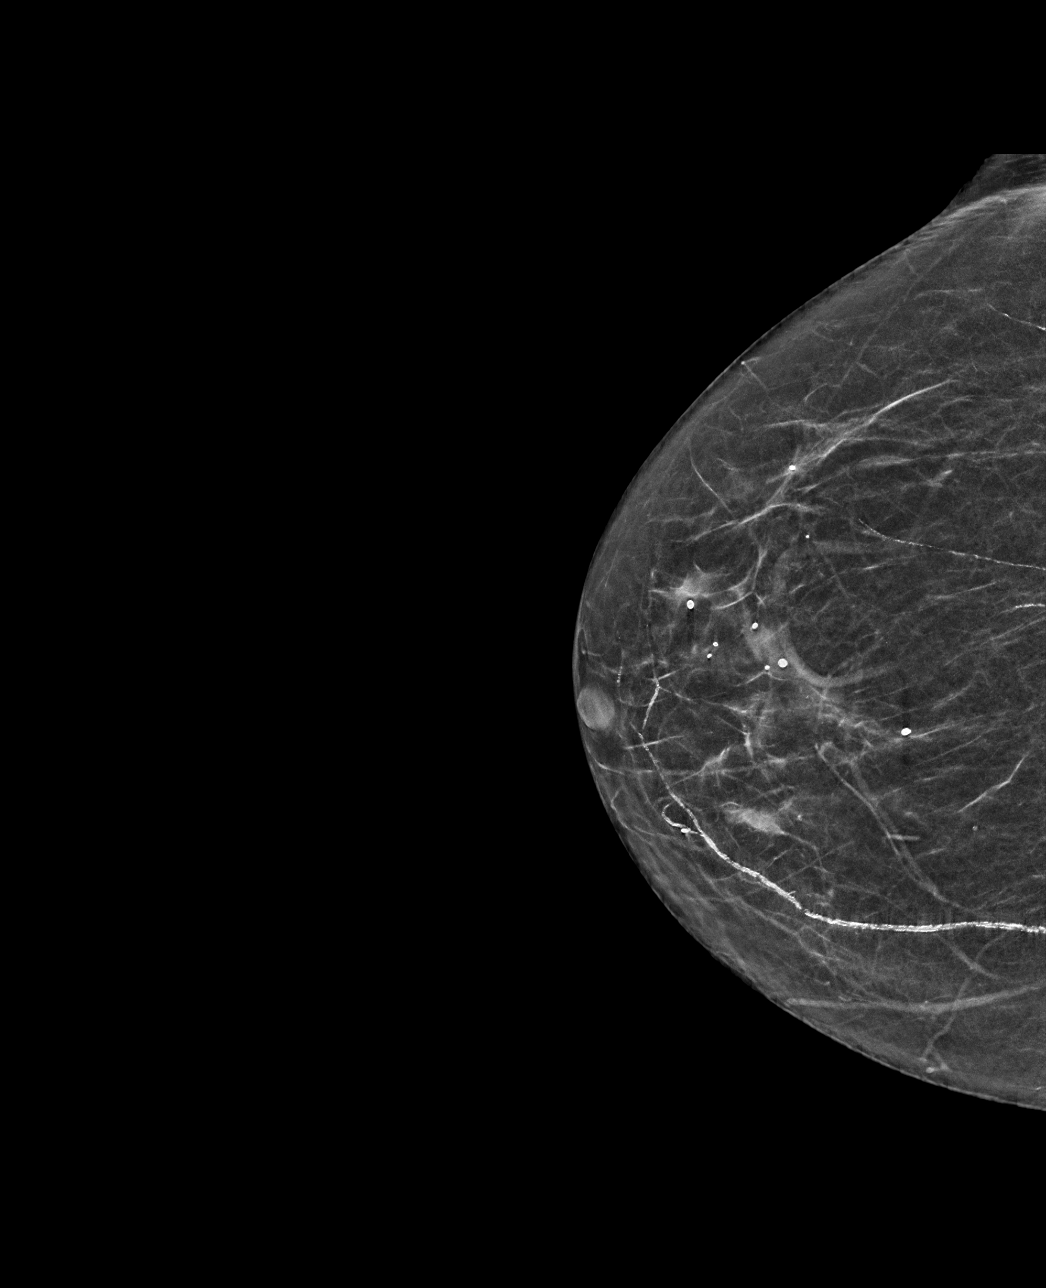

[R CC tomo · tomo slice 28/55.0]
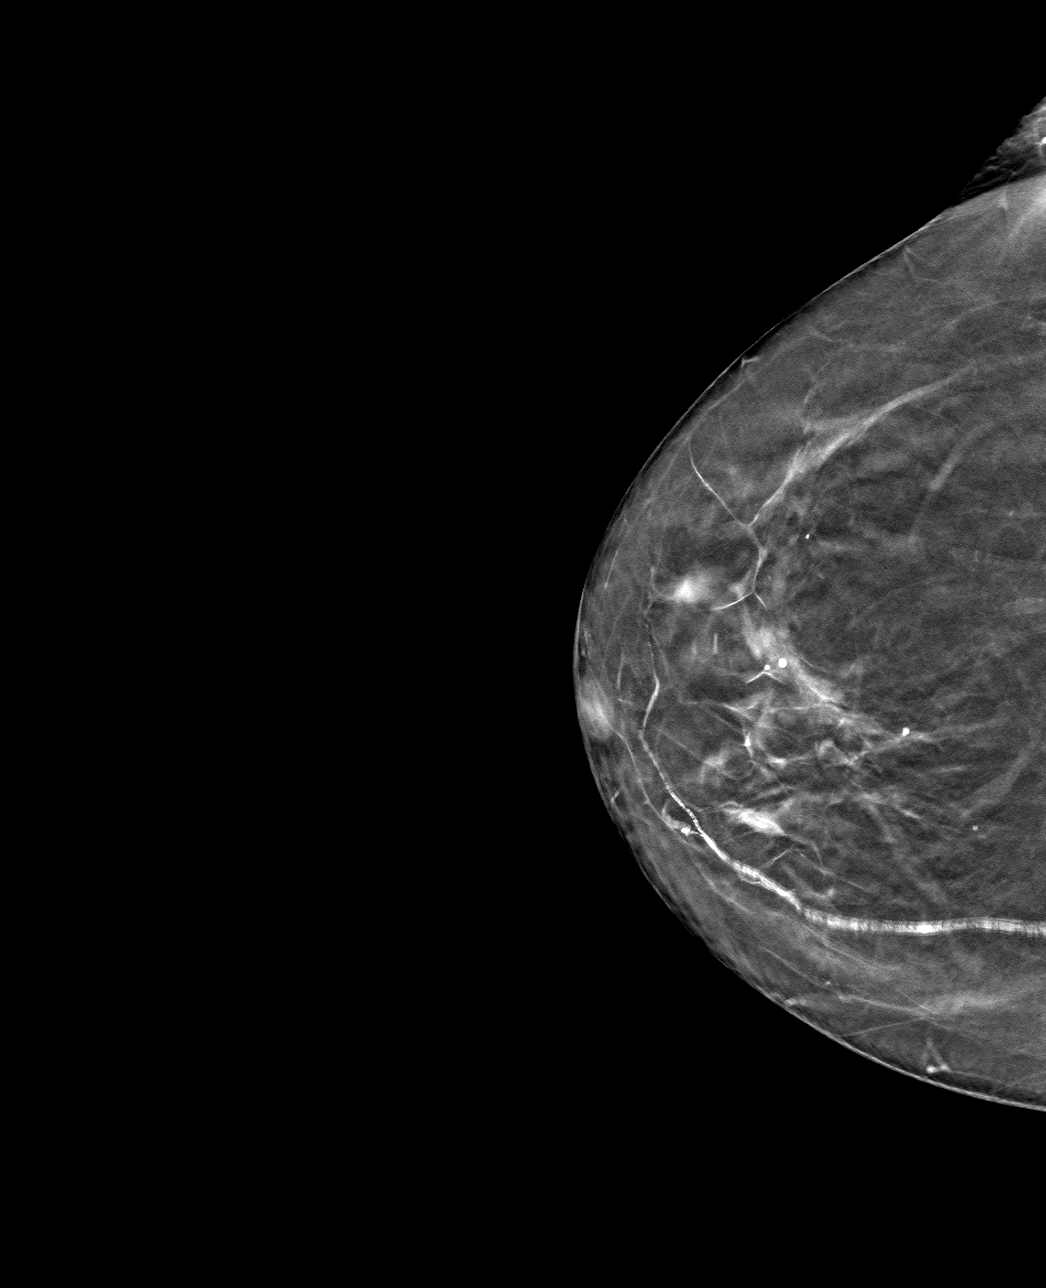

[R MLO tomo · tomo slice 31/62.0]
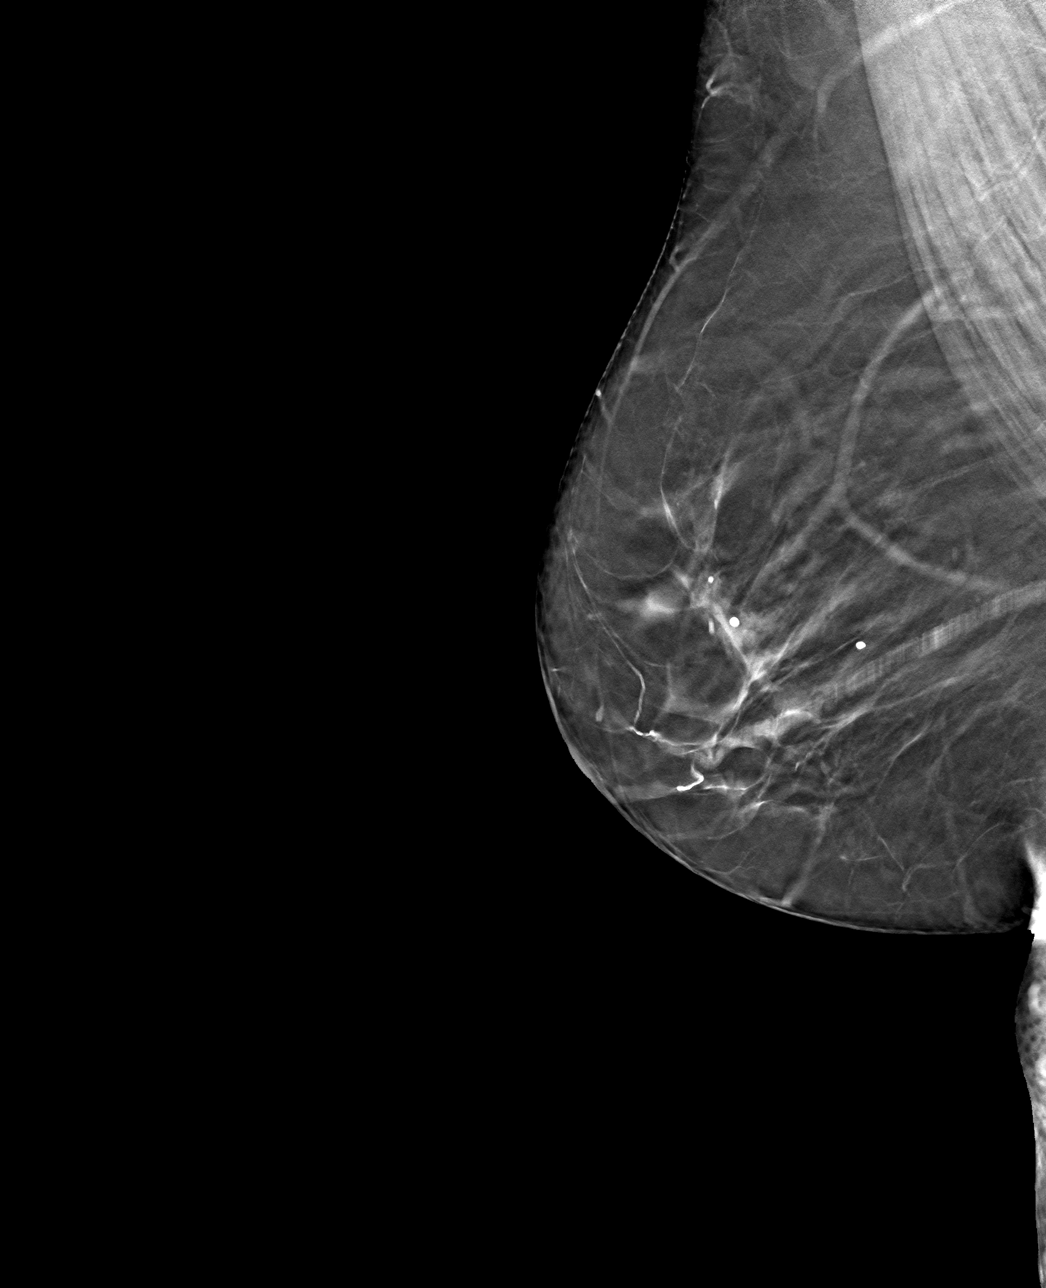

[4 of 12 positions shown; findings below may reference images not displayed]

ACR Breast Density Category b: There are scattered areas of
fibroglandular density.
FINDINGS: The patient has had a left mastectomy. There are no findings
suspicious for malignancy.
IMPRESSION: No mammographic evidence of malignancy. A result letter of this
screening mammogram will be mailed directly to the patient.

RECOMMENDATION:
Screening mammogram in one year.  (Code:NT-E-EGT)

BI-RADS CATEGORY  1: Negative.

## 2024-01-29 NOTE — Progress Notes (Signed)
 Date of Service: 01/29/2024 Patient DOB: November 15, 1950   Subjective:     Patient ID: Theresa Zhang is a 73 y.o. female. Patient comes in for Diabetes, Hyperlipidemia, Hypertension, Obesity, and Anemia The patient is a 73yo female with history of                   -hypertension: on lisinopril  20mg  daily, metoprolol  succinate 25mg  daily, HCTZ 50mg  daily                    -dyslipidemia: Crestor  20mg  daily                     -iron def anemia: not on any iron supplement                -Osteoporosis: Found on DEXA scan done in March 2022; was on alendronate  in the past. Ordered Prolia on 08/23 but was denied by insurance; never tried Raloxifine in the past but declined. 07/24 normal  Vitamin D, but ran out;                       -GERD: controlled on Protonix .                        -history of breast cancer estrogen receptor positive treated with left complete mastectomy (was seeing oncology, was on anastrozole  for 7 years and now off and advised that she did not need to f/u with oncology anymore. Last mammogram on 04/01/23 and neg                 -diabetes mellitus with proteinuria: currently taking Farxiga  10mg  daily, Ozempic 0.25 mg weekly injection; 08/25 glimepiride  2 mg daily was discontinued due to low blood sugars   unable to tolerate metformin.           A1C:         Lab Results        Component     Value   Date        HGBA1C         8.0 (H) 02/25/2023                          Lipids:         Lab Results        Component     Value   Date        CHOL  196      02/25/2023        CHOL  181      08/13/2022        CHOL  182      12/21/2021                 Lab Results        Component     Value   Date        HDL     70        02/25/2023        HDL     62        08/13/2022        HDL     62        12/21/2021                 Lab Results        Component  Value    Date        LDLCALC        103 (H)            02/25/2023        LDLCALC        98        08/13/2022                 Lab Results        Component     Value   Date        TRIG    134      02/25/2023        TRIG    117      08/13/2022        TRIG    144      12/21/2021                 No results found for: CHOLHDL                 Microalbumin: 02/25/23 normal                   Blood sugars: denies any recent lows since stopping  glimepiride            Eye exam:due, 06/04/21 Dr. Roz, neg, seeing Dr. Scarlet on 12/11/22, was ref to new eye doctor in 06/25                                        Foot exam: 10/27/23                   -elevated potassium:                                  -Elevated alkaline phosphatase:                  -Toe nail fungus:                  -leg swelling: on hydrochlorothiazide 50mg  daily. Pt states that she elevates them but does not wear the compression stockings;                Labs:     07/25  CBC, iron, and vitamin D labs are normal.  Patient's CMP had slightly elevated potassium (5.5) which has been elevated in the past and will continue to observe,  A1c for her blood sugars did improve from 8.0-7.7 but would like her A1c closer to 7.0.        10/24 microalbumin, CBC, vitamin D, and iron labs are normal.  Patient's CMP had mildly elevated alkaline phosphatase (109 from 117) which is chronic and stable and will continue to observe.  Patient's A1c is 8.0 which is not at goal of being below 7.0 however did recently increase patient's Farxiga  from 5 mg daily to 10 mg daily and will continue to observe.  Patient's cholesterol was mildly elevated and would recommend to increase patient's Crestor  from 10 mg daily to 20 mg daily.          07/24 vitamin D was normal; pts cmp shows slightly elevated potassium (5.2) which is chronic and will continue to monitor but  avoid taking any multivitamin with potassium in them. Pt's alk phos was  mildly elevated (117 from 123) which is chronic and stable. Pts A1c for her diabetes increased from 7.4 to 8.0 where I would recommend to increase pt's farxiga  from 5mg  daily to 10mg  daily.          05/24 BMP lab shows stable kidney function for patient's potassium level is still mildly elevated (5.4 from 5.3).             Misc:          Son was present to help interpret.   Review of Systems Review of Systems   Medical History[1]  Surgical History[2]    Allergies[3]    Social History   Social History Narrative  . Not on file    Family History[4]    Objective:  BP 114/69   Pulse 72   Temp 97.8 F (36.6 C)   Resp 12   Ht 1.448 m (4' 9.01)   Wt 69.9 kg (154 lb)   SpO2 96%   BMI 33.31 kg/m   Physical Exam Physical Exam Constitutional:      Appearance: Normal appearance.  HENT:     Head: Normocephalic and atraumatic.  Cardiovascular:     Rate and Rhythm: Normal rate and regular rhythm.     Heart sounds: No murmur heard. Pulmonary:     Effort: Pulmonary effort is normal. No respiratory distress.     Breath sounds: No stridor. No wheezing, rhonchi or rales.  Chest:     Chest wall: No tenderness.  Abdominal:     Palpations: Abdomen is soft.     Tenderness: There is no abdominal tenderness.  Musculoskeletal:     Right lower leg: Edema present.     Left lower leg: Edema present.     Comments: Mild to moderate bilateral nonpitting lower extremity edema with symmetry but no erythema or wounds noted.  Neurological:     Mental Status: She is alert.  Psychiatric:        Mood and Affect: Mood normal.        Behavior: Behavior normal.        Thought Content: Thought content normal.        Judgment: Judgment normal.         Labs: No results found for this or any previous visit (from the past week).  Assessment/Plan:  1. Type 2 diabetes mellitus with hyperglycemia, without  long-term current use of insulin     (CMD) (Primary) Office A1c was 7.7.  Increase Ozempic from 2.5 mg weekly injection to 5 mg weekly injection but call for any potential GI issues. Recommend a low fat/carbohydrate diet with increase in exercising.  If blood sugars get below 70, recommend to eat something, recheck the blood sugar in 5 min, and if blood sugar is not at least in the 90s, repeat cycle. Call if you are getting blood sugars below 70.  - POC Hemoglobin A1c - semaglutide (Ozempic) 0.25 mg or 0.5 mg (2 mg/3 mL) subcutaneous pen injector; Inject 0.5 mg under the skin once a week.  Dispense: 3 mL; Refill: 3  2. Essential hypertension controlled, continue current medication  3. Dyslipidemia Controlled, continue Crestor  as directed. Recommend a low fat/carbohydrate diet with increase in exercising.    5. GERD without esophagitis Controlled, continue Protonix  as directed.  5. Leg swelling Leg swelling is persistent even with hydrochlorothiazide 50 mg daily however patient is not wearing her compression stockings and is likely not elevating her legs above the heart while sitting or laying down.  Another prescription for compression stockings was given today. Recommend to elevated the legs above the heart while sitting or lying down and wear compression stockings during the day. Call if 1 leg comes more swollen than the other, if legs become red or warm, if legs become painful, or swelling gets worse.  Follow-up in 3 months or sooner if needed.  Call for any questions/concerns.   Electronically signed by: Shawn P Lazoff, DO 01/29/2024 9:13 AM  This document was created using the aid of voice recognition Dragon dictation software.        [1] Past Medical History: Diagnosis Date  . Cancer    (CMD)    left breast   . Diabetes mellitus   . Hypercholesterolemia   . Hypertension   [2] Past Surgical History: Procedure Laterality Date  . CATARACT EXTRACTION Bilateral 2011    Procedure: CATARACT EXTRACTION; [place]  . EYE SURGERY     Procedure: EYE SURGERY  . MASTECTOMY W/ SENTINEL NODE BIOPSY Left 12/2015   Procedure: MASTECTOMY W/ SENTINEL NODE BIOPSY; estrogen and progesterone receptor positive.  2 lymph nodes removed. Both neg for malignancy.  . OTHER SURGICAL HISTORY Left    Procedure: OTHER SURGICAL HISTORY (left mastectomy); breast cancer.  [3] Allergies Allergen Reactions  . Metformin Diarrhea  [4] Family History Problem Relation Name Age of Onset  . Thyroid disease Sister    . Diabetes Mother    . Diabetes Father    . Cataracts Neg Hx    . Glaucoma Neg Hx

## 2024-03-03 ENCOUNTER — Inpatient Hospital Stay: Attending: Hematology and Oncology | Admitting: Hematology and Oncology

## 2024-03-03 VITALS — BP 133/80 | HR 76 | Temp 97.3°F | Resp 18 | Ht 59.0 in | Wt 153.7 lb

## 2024-03-03 DIAGNOSIS — Z170 Estrogen receptor positive status [ER+]: Secondary | ICD-10-CM

## 2024-03-03 DIAGNOSIS — C50512 Malignant neoplasm of lower-outer quadrant of left female breast: Secondary | ICD-10-CM

## 2024-03-03 NOTE — Assessment & Plan Note (Signed)
 Left mastectomy 01/11/2016: IDC with DCIS, 2 tumors, 2 cm and 1.8 cm, 0/2 lymph nodes negative, T1CmN0 stage IA, grade 2, ER 90%, 90%; PR 95%, 95%; HER-2 negative ratio 1.35, 1.33; Ki-67 5%, 3% Patient was diagnosed July 2017 and was treated with neoadjuvant anastrozole  for 2 months prior to surgery Oncotype DX score for: ROR 4%   Current treatment: Anastrozole  1 mg daily started 02/13/2016-05/09/2022   Anastrozole  toxicities:  Denies any hot flashes or arthralgias or myalgias. Since she completed 7 years of therapy she will discontinue it at this time.   Breast cancer surveillance: 1.  Breast exam 03/03/24 Benign 2. mammogram 04/03/2023 benign breast density category B I renewed her prescription for anastrozole .   I strongly encouraged her to walk every day and to cut down on sugar intake.

## 2024-03-03 NOTE — Progress Notes (Signed)
 Patient Care Team: Ilah Crigler, MD as PCP - General (Family Medicine) Odean Potts, MD as Consulting Physician (Hematology and Oncology) Crawford Morna Pickle, NP as Nurse Practitioner (Hematology and Oncology) Vanderbilt Ned, MD as Consulting Physician (General Surgery)  DIAGNOSIS:  Encounter Diagnosis  Name Primary?   Malignant neoplasm of lower-outer quadrant of left breast of female, estrogen receptor positive (HCC) Yes    SUMMARY OF ONCOLOGIC HISTORY: Oncology History  Breast cancer of lower-outer quadrant of left female breast (HCC)  11/08/2015 Mammogram   Left breast 5:30 position: 1.7 x 1.6 x 2 cm spiculated mass; 6:00: 1.2 x 1.3 x 1.4 cm separated by 3 cm, left axillary lymph nodes 9 mm, clinical stage TIcN1 stage II A   11/16/2015 Initial Diagnosis   Left breast biopsy: 6:00: Grade 2-3 IDC with DCIS with LVI, ER 90%, PR 95%, Ki-67 3%, HER-2 negative ratio 1.33, left breast biopsy 5:30 position: Grade 2-3 IDC, DCIS, LVI, ER 90%, PR 95%, Ki-67 5%, HER-2 negative ratio 1.35   01/11/2016 Surgery   Left mastectomy: IDC with DCIS, 2 tumors, 2 cm and 1.8 cm, 0/2 lymph nodes negative, T1CmN0 stage IA, grade 2, ER 90%, 90%; PR 95%, 95%; HER-2 negative ratio 1.35, 1.33; Ki-67 5%, 3%   01/2016 -  Anti-estrogen oral therapy   Anastrozole  1mg  daily   01/31/2016 Oncotype testing   Oncotype: 4/4%     CHIEF COMPLIANT:   HISTORY OF PRESENT ILLNESS:  History of Present Illness Theresa Zhang is a 73 year old female with diabetes who presents with breast pain. She was referred by her regular doctor due to unavailability of appointments until Tuesday.  Breast pain has been present since last Thursday. She is concerned about the pain and its potential causes. Her sister has mentioned lymphadenopathy as a symptom, which is a concern for her.  She has been on Ozempic for diabetes management for the past three months, with blood sugar levels around 7.0. She is aware that Ozempic  can cause some pain but is unsure if it is related to her current symptoms.  She maintains regular mammograms, with the last one conducted after a previous surgery.     ALLERGIES:  is allergic to metformin.  MEDICATIONS:  Current Outpatient Medications  Medication Sig Dispense Refill   glimepiride  (AMARYL ) 4 MG tablet Take 4 mg by mouth daily. Before breakfast     losartan  (COZAAR ) 25 MG tablet Take 50 mg by mouth daily.     meloxicam (MOBIC) 15 MG tablet Take 15 mg by mouth daily.     metoprolol  succinate (TOPROL -XL) 25 MG 24 hr tablet Take 25 mg by mouth daily.     Semaglutide,0.25 or 0.5MG /DOS, (OZEMPIC, 0.25 OR 0.5 MG/DOSE,) 2 MG/1.5ML SOPN Inject into the skin.     No current facility-administered medications for this visit.    PHYSICAL EXAMINATION: ECOG PERFORMANCE STATUS: 1 - Symptomatic but completely ambulatory  Vitals:   03/03/24 0811  BP: 133/80  Pulse: 76  Resp: 18  Temp: (!) 97.3 F (36.3 C)  SpO2: 95%   Filed Weights   03/03/24 0811  Weight: 153 lb 11.2 oz (69.7 kg)    Physical Exam Breast exam: No palpable lumps or nodules.  (exam performed in the presence of a chaperone)  LABORATORY DATA:  I have reviewed the data as listed    Latest Ref Rng & Units 01/11/2016    9:26 AM  CMP  Glucose 65 - 99 mg/dL 897   BUN 6 - 20  mg/dL 26   Creatinine 9.55 - 1.00 mg/dL 9.12   Sodium 864 - 854 mmol/L 140   Potassium 3.5 - 5.1 mmol/L 4.5   Chloride 101 - 111 mmol/L 107   CO2 22 - 32 mmol/L 25   Calcium  8.9 - 10.3 mg/dL 9.3   Total Protein 6.5 - 8.1 g/dL 7.3   Total Bilirubin 0.3 - 1.2 mg/dL 0.5   Alkaline Phos 38 - 126 U/L 80   AST 15 - 41 U/L 39   ALT 14 - 54 U/L 35     Lab Results  Component Value Date   WBC 7.5 01/11/2016   HGB 12.0 01/11/2016   HCT 39.0 01/11/2016   MCV 82.3 01/11/2016   PLT 290 01/11/2016   NEUTROABS 5.1 01/11/2016    ASSESSMENT & PLAN:  Breast cancer of lower-outer quadrant of left female breast (HCC) Left mastectomy  01/11/2016: IDC with DCIS, 2 tumors, 2 cm and 1.8 cm, 0/2 lymph nodes negative, T1CmN0 stage IA, grade 2, ER 90%, 90%; PR 95%, 95%; HER-2 negative ratio 1.35, 1.33; Ki-67 5%, 3% Patient was diagnosed July 2017 and was treated with neoadjuvant anastrozole  for 2 months prior to surgery Oncotype DX score for: ROR 4%   Prior treatment: Anastrozole  1 mg daily started 02/13/2016-05/09/2022   Breast cancer surveillance: 1.  Breast exam 03/03/24 Benign 2. mammogram 04/03/2023 benign breast density category B   Breast pain: To my exam there is no palpable lumps or nodules.  Patient is due for a mammogram in a month.  I ordered a diagnostic mammogram of the right breast.  Left mastectomy scar without any lumps or nodules.   I strongly encouraged her to walk every day and to cut down on sugar intake. Return to clinic in 1 year for follow-up.  Orders Placed This Encounter  Procedures   MM DIAG BREAST TOMO UNI RIGHT    Standing Status:   Future    Expected Date:   04/01/2024    Expiration Date:   03/03/2025    Reason for Exam (SYMPTOM  OR DIAGNOSIS REQUIRED):   breast pain right breast    Preferred imaging location?:   [facility]    Release to patient:   Immediate [1]   The patient has a good understanding of the overall plan. she agrees with it. she will call with any problems that may develop before the next visit here.  I personally spent a total of 30 minutes in the care of the patient today including preparing to see the patient, getting/reviewing separately obtained history, performing a medically appropriate exam/evaluation, counseling and educating, placing orders, referring and communicating with other health care professionals, documenting clinical information in the EHR, independently interpreting results, communicating results, and coordinating care.   Viinay K Yuliana Vandrunen, MD 03/03/24

## 2024-03-30 ENCOUNTER — Other Ambulatory Visit: Payer: Self-pay | Admitting: Hematology and Oncology

## 2024-03-30 DIAGNOSIS — C50512 Malignant neoplasm of lower-outer quadrant of left female breast: Secondary | ICD-10-CM

## 2024-04-02 ENCOUNTER — Other Ambulatory Visit

## 2024-04-02 ENCOUNTER — Ambulatory Visit
Admission: RE | Admit: 2024-04-02 | Discharge: 2024-04-02 | Disposition: A | Discharge status: Home / Self Care | Source: Ambulatory Visit | Attending: Hematology and Oncology | Admitting: Hematology and Oncology

## 2024-04-02 DIAGNOSIS — C50512 Malignant neoplasm of lower-outer quadrant of left female breast: Secondary | ICD-10-CM

## 2025-03-03 ENCOUNTER — Inpatient Hospital Stay: Admitting: Hematology and Oncology
